# Patient Record
Sex: Female | Born: 1975 | Race: White | Hispanic: No | Marital: Married | Smoking: Never smoker
Health system: Southern US, Community
[De-identification: ages and names within clinical notes are randomized; demographics above are authoritative.]

## PROBLEM LIST (undated history)

## (undated) DIAGNOSIS — T4145XA Adverse effect of unspecified anesthetic, initial encounter: Secondary | ICD-10-CM

## (undated) DIAGNOSIS — O00109 Unspecified tubal pregnancy without intrauterine pregnancy: Secondary | ICD-10-CM

## (undated) DIAGNOSIS — E7212 Methylenetetrahydrofolate reductase deficiency: Secondary | ICD-10-CM

## (undated) DIAGNOSIS — M545 Low back pain, unspecified: Secondary | ICD-10-CM

## (undated) DIAGNOSIS — E7211 Homocystinuria: Secondary | ICD-10-CM

## (undated) DIAGNOSIS — T65811A Toxic effect of latex, accidental (unintentional), initial encounter: Secondary | ICD-10-CM

## (undated) DIAGNOSIS — G8929 Other chronic pain: Secondary | ICD-10-CM

## (undated) DIAGNOSIS — T7801XA Anaphylactic reaction due to peanuts, initial encounter: Secondary | ICD-10-CM

## (undated) DIAGNOSIS — O24419 Gestational diabetes mellitus in pregnancy, unspecified control: Secondary | ICD-10-CM

## (undated) DIAGNOSIS — T8859XA Other complications of anesthesia, initial encounter: Secondary | ICD-10-CM

## (undated) DIAGNOSIS — IMO0002 Reserved for concepts with insufficient information to code with codable children: Secondary | ICD-10-CM

## (undated) DIAGNOSIS — I499 Cardiac arrhythmia, unspecified: Secondary | ICD-10-CM

## (undated) DIAGNOSIS — D4709 Other mast cell neoplasms of uncertain behavior: Secondary | ICD-10-CM

## (undated) HISTORY — DX: Unspecified tubal pregnancy without intrauterine pregnancy: O00.109

## (undated) HISTORY — DX: Methylenetetrahydrofolate reductase deficiency: E72.11

## (undated) HISTORY — DX: Anaphylactic reaction due to peanuts, initial encounter: T78.01XA

## (undated) HISTORY — DX: Reserved for concepts with insufficient information to code with codable children: IMO0002

## (undated) HISTORY — PX: DILATION AND CURETTAGE OF UTERUS: SHX78

## (undated) HISTORY — DX: Homocystinuria: E72.12

## (undated) HISTORY — DX: Toxic effect of latex, accidental (unintentional), initial encounter: T65.811A

---

## 1999-05-23 DIAGNOSIS — O24419 Gestational diabetes mellitus in pregnancy, unspecified control: Secondary | ICD-10-CM

## 2005-05-22 HISTORY — PX: SPINE SURGERY: SHX786

## 2005-05-22 HISTORY — PX: AUGMENTATION MAMMAPLASTY: SUR837

## 2009-10-25 ENCOUNTER — Ambulatory Visit: Payer: Self-pay | Admitting: Physical Medicine and Rehabilitation

## 2010-01-12 ENCOUNTER — Ambulatory Visit: Payer: Self-pay | Admitting: Physical Medicine and Rehabilitation

## 2010-05-22 HISTORY — PX: LAPAROSCOPY FOR ECTOPIC PREGNANCY: SUR765

## 2010-09-03 ENCOUNTER — Emergency Department: Payer: Self-pay | Admitting: Internal Medicine

## 2010-09-04 ENCOUNTER — Emergency Department: Payer: Self-pay | Admitting: Emergency Medicine

## 2010-10-16 ENCOUNTER — Emergency Department: Payer: Self-pay | Admitting: Emergency Medicine

## 2011-01-02 ENCOUNTER — Emergency Department: Payer: Self-pay | Admitting: Emergency Medicine

## 2011-01-08 ENCOUNTER — Ambulatory Visit: Payer: Self-pay | Admitting: Emergency Medicine

## 2011-01-16 ENCOUNTER — Emergency Department: Payer: Self-pay | Admitting: Emergency Medicine

## 2011-01-19 ENCOUNTER — Emergency Department: Payer: Self-pay | Admitting: Emergency Medicine

## 2011-01-21 DIAGNOSIS — O00109 Unspecified tubal pregnancy without intrauterine pregnancy: Secondary | ICD-10-CM

## 2011-01-21 HISTORY — DX: Unspecified tubal pregnancy without intrauterine pregnancy: O00.109

## 2011-01-27 ENCOUNTER — Ambulatory Visit: Payer: Self-pay | Admitting: General Practice

## 2011-03-10 ENCOUNTER — Emergency Department: Payer: Self-pay | Admitting: Emergency Medicine

## 2011-04-22 HISTORY — PX: CHOLECYSTECTOMY: SHX55

## 2011-05-10 ENCOUNTER — Ambulatory Visit (INDEPENDENT_AMBULATORY_CARE_PROVIDER_SITE_OTHER): Payer: PRIVATE HEALTH INSURANCE | Admitting: Internal Medicine

## 2011-05-10 ENCOUNTER — Ambulatory Visit: Payer: Self-pay | Admitting: Obstetrics and Gynecology

## 2011-05-10 ENCOUNTER — Encounter: Payer: Self-pay | Admitting: Internal Medicine

## 2011-05-10 DIAGNOSIS — Z8759 Personal history of other complications of pregnancy, childbirth and the puerperium: Secondary | ICD-10-CM | POA: Insufficient documentation

## 2011-05-10 DIAGNOSIS — R739 Hyperglycemia, unspecified: Secondary | ICD-10-CM

## 2011-05-10 DIAGNOSIS — T782XXA Anaphylactic shock, unspecified, initial encounter: Secondary | ICD-10-CM

## 2011-05-10 DIAGNOSIS — Z3169 Encounter for other general counseling and advice on procreation: Secondary | ICD-10-CM

## 2011-05-10 DIAGNOSIS — T781XXA Other adverse food reactions, not elsewhere classified, initial encounter: Secondary | ICD-10-CM

## 2011-05-10 DIAGNOSIS — E7889 Other lipoprotein metabolism disorders: Secondary | ICD-10-CM

## 2011-05-10 DIAGNOSIS — R7309 Other abnormal glucose: Secondary | ICD-10-CM

## 2011-05-10 DIAGNOSIS — T7840XA Allergy, unspecified, initial encounter: Secondary | ICD-10-CM

## 2011-05-10 DIAGNOSIS — E789 Disorder of lipoprotein metabolism, unspecified: Secondary | ICD-10-CM

## 2011-05-10 DIAGNOSIS — E785 Hyperlipidemia, unspecified: Secondary | ICD-10-CM

## 2011-05-10 DIAGNOSIS — M48061 Spinal stenosis, lumbar region without neurogenic claudication: Secondary | ICD-10-CM | POA: Insufficient documentation

## 2011-05-10 LAB — COMPREHENSIVE METABOLIC PANEL
ALT: 15 U/L (ref 0–35)
CO2: 27 mEq/L (ref 19–32)
Calcium: 9 mg/dL (ref 8.4–10.5)
Chloride: 107 mEq/L (ref 96–112)
Creatinine, Ser: 0.8 mg/dL (ref 0.4–1.2)
GFR: 86.62 mL/min (ref >60.00)
Glucose, Bld: 110 mg/dL — ABNORMAL HIGH (ref 70–99)
Sodium: 142 mEq/L (ref 135–145)
Total Bilirubin: 0.5 mg/dL (ref 0.3–1.2)
Total Protein: 7.9 g/dL (ref 6.0–8.3)

## 2011-05-10 LAB — LIPID PANEL
Cholesterol: 188 mg/dL (ref 0–200)
HDL: 44.8 mg/dL (ref >39.00)
VLDL: 17.8 mg/dL (ref 0.0–40.0)

## 2011-05-10 MED ORDER — CETIRIZINE HCL 10 MG PO TABS: 10.0000 mg | ORAL_TABLET | Freq: Every day | ORAL | Status: DC

## 2011-05-10 MED ORDER — RANITIDINE HCL 300 MG PO TABS: 300.0000 mg | ORAL_TABLET | Freq: Every day | ORAL | Status: DC

## 2011-05-10 MED ORDER — MONTELUKAST SODIUM 10 MG PO TABS: 10.0000 mg | ORAL_TABLET | Freq: Every day | ORAL | Status: DC

## 2011-05-10 NOTE — Patient Instructions (Signed)
Consider Singulair,  zantac and zyrtec for complete histamine blockade

## 2011-05-10 NOTE — Progress Notes (Signed)
Subjective:    Patient ID: Susan Sullivan, female    DOB: 11-Jul-1975, 35 y.o.   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  She has several medical issues to discuss.  She had abnormal non fasting lipids at [place] (likely postprandial) and is requesting a recheck today since she is fasting.  She had a total cholesterol of 150 in 2011.  She has a history of gestational diabetes and has had a normal A1c currently.   Other CC is persistent pelvic pain on the right following an ectopic pregnancy on the left and ruptured ovarian cyst on the right.  She is scheduled to see Dr. Greggory Keen today to clarify the prior surgery that was done by Dr. Patton Salles.  Apparently she had an emergent resection of her left fallopian tube due to an ectopic pregnancy which ruptured but also had an ovarian cyst on the contralateral side and is not shure whether she has a viable tube at this point for future desired pregnancy.   She has a complex allergy history .  She had an episode of anaphylaxis to peanut butter May 2011, occurred in ER .  Did not require intubation. On another occasion  she developed hives on tongue, and was diagnosed with Oral allergy Syndrome by allergist Judeth Porch at [facility] after having anincomplete RAST testing by [facility] .  Cannot eat raw fresh fruit or veggies.  She also has allergies to Latex and egg allergy, as well as all nuts.  Pregnancy seems to aggravate her allergies, and despite having three children, she desires another.    Past Medical History  Diagnosis Date  . Ectopic pregnancy, tubal Sept 2012    left,  with rupture ,  and concurrent right ovarian cyst rupture  . Degenerative disk disease     not on medication due to attempts to get pregant  . 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   Review of Systems  Constitutional: Negative for fever, chills  and unexpected weight change.  HENT: Negative for hearing loss, ear pain, nosebleeds, congestion, sore throat, facial swelling, rhinorrhea, sneezing, mouth sores, trouble swallowing, neck pain, neck stiffness, voice change, postnasal drip, sinus pressure, tinnitus and ear discharge.   Eyes: Negative for pain, discharge, redness and visual disturbance.  Respiratory: Negative for cough, chest tightness, shortness of breath, wheezing and stridor.   Cardiovascular: Negative for chest pain, palpitations and leg swelling.  Genitourinary: Positive for flank pain and pelvic pain.  Musculoskeletal: Negative for myalgias and arthralgias.  Skin: Negative for color change and rash.  Neurological: Negative for dizziness, weakness, light-headedness and headaches.  Hematological: Negative for adenopathy.   BP 122/80  Pulse 83  Temp(Src) 98.8 F (37.1 C) (Oral)  Ht 5\' 8"  (1.727 m)  Wt 203 lb (92.08 kg)  BMI 30.87 kg/m2  SpO2 100%  LMP 04/18/2011    Objective:   Physical Exam  Constitutional: She is oriented to person, place, and time. She appears well-developed and well-nourished.  HENT:  Mouth/Throat: Oropharynx is clear and moist.  Eyes: EOM are normal. Pupils are equal, round, and reactive to light. No scleral icterus.  Neck: Normal range of motion. Neck supple. No JVD present. No thyromegaly present.  Cardiovascular: Normal rate, regular rhythm, normal heart sounds and intact distal pulses.   Pulmonary/Chest: Effort normal and breath sounds normal.  Abdominal: Soft. Bowel sounds are normal. She exhibits no mass. There is no tenderness.  Musculoskeletal: Normal range of motion. She exhibits no edema.  Lymphadenopathy:    She has no cervical adenopathy.  Neurological: She is alert and oriented to person, place, and time.  Skin: Skin is warm and dry.  Psychiatric: She has a normal mood and affect.       Assessment & Plan:   Oral allergy syndrome Diagnosed at [facility] with history of multiple  anaphylactic or near anaphylactic events,.  Suggested daily use of singulair,  allegra, and zantac for total histamine blockage,  All are acceptable in pregnancy , since she is desiriing to conceive.   Pre-conception counseling Discussed her need to start taking folic acid 1 mg daily to prevent neural tube defects. She does not drink or smoke.  All previosuly mentioned mediations to treat OAS are acceptable in pregnancy.   Lipids abnormal Fasting lipids will be repeated today.     Updated Medication List Outpatient Encounter Prescriptions as of 05/10/2011  Medication Sig Dispense Refill  . cetirizine (ZYRTEC) 10 MG tablet Take 1 tablet (10 mg total) by mouth daily.  90 tablet  3  . montelukast (SINGULAIR) 10 MG tablet Take 1 tablet (10 mg total) by mouth at bedtime.  90 tablet  3  . ranitidine (ZANTAC) 300 MG tablet Take 1 tablet (300 mg total) by mouth at bedtime.  90 tablet  3

## 2011-05-12 ENCOUNTER — Emergency Department: Payer: Self-pay | Admitting: *Deleted

## 2011-05-14 ENCOUNTER — Encounter: Payer: Self-pay | Admitting: Internal Medicine

## 2011-05-14 DIAGNOSIS — E7889 Other lipoprotein metabolism disorders: Secondary | ICD-10-CM | POA: Insufficient documentation

## 2011-05-14 DIAGNOSIS — T65811A Toxic effect of latex, accidental (unintentional), initial encounter: Secondary | ICD-10-CM | POA: Insufficient documentation

## 2011-05-14 DIAGNOSIS — T7801XA Anaphylactic reaction due to peanuts, initial encounter: Secondary | ICD-10-CM | POA: Insufficient documentation

## 2011-05-14 DIAGNOSIS — T781XXA Other adverse food reactions, not elsewhere classified, initial encounter: Secondary | ICD-10-CM | POA: Insufficient documentation

## 2011-05-14 DIAGNOSIS — Z3169 Encounter for other general counseling and advice on procreation: Secondary | ICD-10-CM | POA: Insufficient documentation

## 2011-05-14 NOTE — Assessment & Plan Note (Signed)
Discussed her need to start taking folic acid 1 mg daily to prevent neural tube defects. She does not drink or smoke.  All previosuly mentioned mediations to treat OAS are acceptable in pregnancy.

## 2011-05-14 NOTE — Assessment & Plan Note (Signed)
Fasting lipids will be repeated today.

## 2011-05-14 NOTE — Assessment & Plan Note (Signed)
Diagnosed at [facility] with history of multiple anaphylactic or near anaphylactic events,.  Suggested daily use of singulair,  allegra, and zantac for total histamine blockage,  All are acceptable in pregnancy , since she is desiriing to conceive.

## 2011-05-17 ENCOUNTER — Ambulatory Visit: Payer: Self-pay | Admitting: Vascular Surgery

## 2011-07-14 ENCOUNTER — Other Ambulatory Visit: Payer: Self-pay | Admitting: Physician Assistant

## 2011-07-14 LAB — HCG, QUANTITATIVE, PREGNANCY: Beta Hcg, Quant.: 59 m[IU]/mL — ABNORMAL HIGH

## 2011-07-16 ENCOUNTER — Other Ambulatory Visit: Payer: Self-pay | Admitting: Physician Assistant

## 2011-07-16 LAB — HCG, QUANTITATIVE, PREGNANCY: Beta Hcg, Quant.: 202 m[IU]/mL — ABNORMAL HIGH

## 2011-09-10 ENCOUNTER — Emergency Department: Payer: Self-pay | Admitting: Emergency Medicine

## 2011-09-10 LAB — COMPREHENSIVE METABOLIC PANEL
Albumin: 4 g/dL (ref 3.4–5.0)
Alkaline Phosphatase: 80 U/L (ref 50–136)
Alkaline Phosphatase: 100 U/L (ref 50–136)
Anion Gap: 12 (ref 7–16)
Anion Gap: 10 (ref 7–16)
BUN: 9 mg/dL (ref 7–18)
Bilirubin,Total: 0.3 mg/dL (ref 0.2–1.0)
Bilirubin,Total: 0.5 mg/dL (ref 0.2–1.0)
Co2: 25 mmol/L (ref 21–32)
Creatinine: 0.66 mg/dL (ref 0.60–1.30)
EGFR (African American): >60
EGFR (Non-African Amer.): >60
Osmolality: 285 (ref 275–301)
Osmolality: 279 (ref 275–301)
SGOT(AST): 152 U/L — ABNORMAL HIGH (ref 15–37)
SGPT (ALT): 140 U/L — ABNORMAL HIGH
Sodium: 141 mmol/L (ref 136–145)
Total Protein: 8.2 g/dL (ref 6.4–8.2)
Total Protein: 7.9 g/dL (ref 6.4–8.2)

## 2011-09-10 LAB — URINALYSIS, COMPLETE
Bacteria: NONE SEEN
Bilirubin,UR: NEGATIVE
Glucose,UR: NEGATIVE mg/dL (ref 0–75)
Ketone: NEGATIVE
Ketone: NEGATIVE
Leukocyte Esterase: NEGATIVE
Nitrite: NEGATIVE
Protein: NEGATIVE
RBC,UR: NONE SEEN /HPF (ref 0–5)
RBC,UR: NONE SEEN /HPF (ref 0–5)
Specific Gravity: 1.005 (ref 1.003–1.030)
Specific Gravity: 1.005 (ref 1.003–1.030)
Squamous Epithelial: 2
Squamous Epithelial: 1
WBC UR: NONE SEEN /HPF (ref 0–5)

## 2011-09-10 LAB — DIFFERENTIAL
Basophil #: 0 10*3/uL (ref 0.0–0.1)
Lymphocyte %: 19.1 %
Neutrophil #: 4.6 10*3/uL (ref 1.4–6.5)
Neutrophil %: 73.3 %

## 2011-09-10 LAB — CBC
HCT: 38.1 % (ref 35.0–47.0)
HGB: 13.2 g/dL (ref 12.0–16.0)
MCH: 29.7 pg (ref 26.0–34.0)
MCHC: 32.6 g/dL (ref 32.0–36.0)
MCV: 91 fL (ref 80–100)
Platelet: 191 10*3/uL (ref 150–440)
RBC: 4.44 10*6/uL (ref 3.80–5.20)
RBC: 4.23 10*6/uL (ref 3.80–5.20)
WBC: 6.3 10*3/uL (ref 3.6–11.0)

## 2011-09-11 ENCOUNTER — Observation Stay: Payer: Self-pay | Admitting: Obstetrics & Gynecology

## 2011-09-11 LAB — COMPREHENSIVE METABOLIC PANEL
Albumin: 3.2 g/dL — ABNORMAL LOW (ref 3.4–5.0)
Anion Gap: 6 — ABNORMAL LOW (ref 7–16)
Bilirubin,Total: 0.4 mg/dL (ref 0.2–1.0)
Calcium, Total: 8.2 mg/dL — ABNORMAL LOW (ref 8.5–10.1)
Chloride: 108 mmol/L — ABNORMAL HIGH (ref 98–107)
Co2: 28 mmol/L (ref 21–32)
Creatinine: 0.65 mg/dL (ref 0.60–1.30)
EGFR (Non-African Amer.): >60
Glucose: 90 mg/dL (ref 65–99)
Osmolality: 280 (ref 275–301)
SGPT (ALT): 246 U/L — ABNORMAL HIGH
Sodium: 142 mmol/L (ref 136–145)

## 2011-09-11 LAB — GENTAMICIN LEVEL, TROUGH: Gentamicin, Trough: 0.9 ug/mL (ref 0.0–2.0)

## 2011-09-12 LAB — GENTAMICIN LEVEL, PEAK: Gentamicin, Peak: 7.3 ug/mL (ref 4.0–8.0)

## 2011-09-12 LAB — COMPREHENSIVE METABOLIC PANEL
Anion Gap: 7 (ref 7–16)
BUN: 9 mg/dL (ref 7–18)
Co2: 29 mmol/L (ref 21–32)
EGFR (African American): >60
Osmolality: 284 (ref 275–301)
Potassium: 3.8 mmol/L (ref 3.5–5.1)
SGOT(AST): 109 U/L — ABNORMAL HIGH (ref 15–37)
SGPT (ALT): 177 U/L — ABNORMAL HIGH
Sodium: 143 mmol/L (ref 136–145)
Total Protein: 6.8 g/dL (ref 6.4–8.2)

## 2011-09-12 LAB — LACTATE DEHYDROGENASE: LDH: 183 U/L (ref 84–246)

## 2011-09-14 LAB — PATHOLOGY REPORT

## 2011-11-07 ENCOUNTER — Ambulatory Visit: Payer: PRIVATE HEALTH INSURANCE | Admitting: Internal Medicine

## 2012-01-04 ENCOUNTER — Ambulatory Visit (INDEPENDENT_AMBULATORY_CARE_PROVIDER_SITE_OTHER): Payer: PRIVATE HEALTH INSURANCE | Admitting: Internal Medicine

## 2012-01-04 ENCOUNTER — Other Ambulatory Visit: Payer: Self-pay | Admitting: Internal Medicine

## 2012-01-04 ENCOUNTER — Encounter: Payer: Self-pay | Admitting: Internal Medicine

## 2012-01-04 VITALS — BP 154/92 | HR 108 | Temp 97.7°F | Resp 18 | Wt 210.0 lb

## 2012-01-04 DIAGNOSIS — E7212 Methylenetetrahydrofolate reductase deficiency: Secondary | ICD-10-CM | POA: Insufficient documentation

## 2012-01-04 DIAGNOSIS — T781XXA Other adverse food reactions, not elsewhere classified, initial encounter: Secondary | ICD-10-CM

## 2012-01-04 DIAGNOSIS — R030 Elevated blood-pressure reading, without diagnosis of hypertension: Secondary | ICD-10-CM

## 2012-01-04 DIAGNOSIS — E7210 Disorders of sulfur-bearing amino-acid metabolism, unspecified: Secondary | ICD-10-CM

## 2012-01-04 LAB — CBC WITH DIFFERENTIAL/PLATELET
Basophil %: 0.8 %
Eosinophil #: 0.1 10*3/uL (ref 0.0–0.7)
Eosinophil %: 2.4 %
HGB: 12.9 g/dL (ref 12.0–16.0)
Lymphocyte %: 16.9 %
Monocyte %: 7.2 %
Neutrophil %: 72.7 %
RBC: 4.25 10*6/uL (ref 3.80–5.20)

## 2012-01-04 MED ORDER — L-METHYLFOLATE 15 MG PO TABS: 1.0000 | ORAL_TABLET | Freq: Every day | ORAL | Status: DC

## 2012-01-04 NOTE — Assessment & Plan Note (Addendum)
Diagnosed in April  after her third miscarriage and subsequent D&C resulted in a uterine clot with retained POC. Advised her to start the Deplin and have sent a prescription to [facility] for continued use.

## 2012-01-04 NOTE — Progress Notes (Signed)
Patient ID: Maggie Font, female   DOB: [birth year], 36 y.o.   MRN: [id number]   Patient Active Problem List  Diagnosis  . History of ectopic pregnancy  . Degenerative disk disease  . 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 Oral allergy syndrome  . Pre-conception counseling  . Lipids abnormal  . Methylenetetrahydrofolate reductase (MTHFR) gene mutation  . Elevated blood pressure reading without diagnosis of hypertension    Subjective:  CC:   Chief Complaint  Patient presents with  . Medication Problem    HPI:   Aideliz Porter-Mooreis a 36 y.o. female who presents  Past Medical History  Diagnosis Date  . Ectopic pregnancy, tubal Sept 2012    left,  with rupture ,  and concurrent right ovarian cyst rupture  . Degenerative disk disease     not on medication due to attempts to get pregant  . 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 Cesarean section   . Dilation and curettage of uterus 2012  . Spine surgery 2007    SI joint fusion [facility]         The following portions of the patient's history were reviewed and updated as appropriate: Allergies, current medications, and problem list.    Review of Systems:   12 Pt  review of systems was negative except those addressed in the HPI,     History   Social History  . Marital Status: Married    Spouse Name: N/A    Number of Children: N/A  . Years of Education: N/A   Occupational History  . Not on file.   Social History Main Topics  . Smoking status: Never Smoker   . Smokeless tobacco: Not on file  . Alcohol Use: Not on file  . Drug Use: Not on file  . Sexually Active: Not on file   Other Topics Concern  . Not on file   Social History Narrative  . No narrative on file    Objective:  BP 154/92  Pulse 108  Temp 97.7 F (36.5 C) (Oral)  Resp 18  Wt 210 lb (95.255 kg)  SpO2 98%  LMP  12/07/2011  General appearance: alert, cooperative and appears stated age Ears: normal TM's and external ear canals both ears Throat: lips, mucosa, and tongue normal; teeth and gums normal Neck: no adenopathy, no carotid bruit, supple, symmetrical, trachea midline and thyroid not enlarged, symmetric, no tenderness/mass/nodules Back: symmetric, no curvature. ROM normal. No CVA tenderness. Lungs: clear to auscultation bilaterally Heart: regular rate and rhythm, S1, S2 normal, no murmur, click, rub or gallop Abdomen: soft, non-tender; bowel sounds normal; no masses,  no organomegaly Pulses: 2+ and symmetric Skin: Skin color, texture, turgor normal. No rashes or lesions Lymph nodes: Cervical, supraclavicular, and axillary nodes normal.  Assessment and Plan:  Methylenetetrahydrofolate reductase (MTHFR) gene mutation Diagnosed in April  after her third miscarriage and subsequent D&C resulted in a uterine clot with retained POC. Advised her to start the Deplin and have sent a prescription to [facility] for continued use.  Oral allergy syndrome Her multiple food and drug allergies have now been explained by the diagnosis of MTHFR.  It is not clear whether treatment of the syndrome will resolve her allergies are not. I suggested that she not rechallenge herself.  Elevated blood pressure reading without diagnosis of hypertension She has been normotensive in the past. Today's  reading may be due to the oral decongestants that she has been taking for a viral respiratory infection. She will have it rechecked in the ER at work once she is off of the medications.   Updated Medication List Outpatient Encounter Prescriptions as of 01/04/2012  Medication Sig Dispense Refill  . Ascorbic Acid (VITAMIN C) 100 MG tablet Take 100 mg by mouth daily.      [name] aspirin 81 MG tablet Take 81 mg by mouth daily.      . cetirizine (ZYRTEC) 10 MG tablet Take 1 tablet (10 mg total) by mouth daily.  90 tablet  3  . montelukast  (SINGULAIR) 10 MG tablet Take 1 tablet (10 mg total) by mouth at bedtime.  90 tablet  3  . Prenatal Vit-Fe Fumarate-FA (PRENATAL MULTIVITAMIN) TABS Take 1 tablet by mouth daily.      . ranitidine (ZANTAC) 300 MG tablet Take 1 tablet (300 mg total) by mouth at bedtime.  90 tablet  3  . vitamin B-12 (CYANOCOBALAMIN) 100 MCG tablet Take 50 mcg by mouth daily.      [name] L-Methylfolate 15 MG TABS Take 1 tablet (15 mg total) by mouth daily.  30 tablet  6     No orders of the defined types were placed in this encounter.    Return in about 3 months (around 04/05/2012).

## 2012-01-04 NOTE — Patient Instructions (Addendum)
You have a viral  Syndrome .  The post nasal drip is causing your sore throat.  Lavage your sinuses twice daly with Simply saline nasal spray.  Use benadryl 25 mg every 8 hours and Sudafed PE 10 to 30 every 8 hours to manage the drainage and congestion.  Gargle with salt water often for the sore throat. .  If the throat is no better  In 3 to 4 days OR  if you develop T > 100.4,  Green nasal discharge,  Or facial pain,  E mAIL ME ON mYCHART for an antibiotic.

## 2012-01-07 ENCOUNTER — Encounter: Payer: Self-pay | Admitting: Internal Medicine

## 2012-01-07 DIAGNOSIS — R030 Elevated blood-pressure reading, without diagnosis of hypertension: Secondary | ICD-10-CM | POA: Insufficient documentation

## 2012-01-07 NOTE — Assessment & Plan Note (Signed)
She has been normotensive in the past. Today's reading may be due to the oral decongestants that she has been taking for a viral respiratory infection. She will have it rechecked in the ER at work once she is off of the medications.

## 2012-01-07 NOTE — Assessment & Plan Note (Signed)
Her multiple food and drug allergies have now been explained by the diagnosis of MTHFR.  It is not clear whether treatment of the syndrome will resolve her allergies are not. I suggested that she not rechallenge herself.

## 2012-01-08 ENCOUNTER — Telehealth: Payer: Self-pay | Admitting: Internal Medicine

## 2012-01-08 NOTE — Telephone Encounter (Signed)
Her cbc was normal

## 2012-01-08 NOTE — Telephone Encounter (Signed)
Patient notified

## 2012-01-09 ENCOUNTER — Encounter: Payer: Self-pay | Admitting: Internal Medicine

## 2012-01-09 DIAGNOSIS — J069 Acute upper respiratory infection, unspecified: Secondary | ICD-10-CM

## 2012-01-09 MED ORDER — AZITHROMYCIN 250 MG PO TABS: ORAL_TABLET | ORAL | Status: AC

## 2012-01-12 ENCOUNTER — Encounter: Payer: Self-pay | Admitting: Internal Medicine

## 2012-01-15 ENCOUNTER — Telehealth: Payer: Self-pay | Admitting: Internal Medicine

## 2012-01-15 DIAGNOSIS — N760 Acute vaginitis: Secondary | ICD-10-CM

## 2012-01-15 MED ORDER — FLUCONAZOLE 150 MG PO TABS: 150.0000 mg | ORAL_TABLET | Freq: Every day | ORAL | Status: AC

## 2012-01-19 ENCOUNTER — Encounter: Payer: Self-pay | Admitting: Internal Medicine

## 2012-02-23 ENCOUNTER — Encounter: Payer: Self-pay | Admitting: Internal Medicine

## 2012-02-23 ENCOUNTER — Ambulatory Visit (INDEPENDENT_AMBULATORY_CARE_PROVIDER_SITE_OTHER): Payer: PRIVATE HEALTH INSURANCE | Admitting: Internal Medicine

## 2012-02-23 VITALS — BP 118/72 | HR 111 | Temp 98.4°F | Ht 67.5 in | Wt 209.5 lb

## 2012-02-23 DIAGNOSIS — M546 Pain in thoracic spine: Secondary | ICD-10-CM

## 2012-02-23 DIAGNOSIS — R197 Diarrhea, unspecified: Secondary | ICD-10-CM

## 2012-02-23 DIAGNOSIS — R1011 Right upper quadrant pain: Secondary | ICD-10-CM

## 2012-02-23 MED ORDER — DICLOFENAC EPOLAMINE 1.3 % TD PTCH: 1.0000 | MEDICATED_PATCH | Freq: Two times a day (BID) | TRANSDERMAL | Status: DC

## 2012-02-23 MED ORDER — CHOLESTYRAMINE 4 GM/DOSE PO POWD: 2.0000 g | Freq: Three times a day (TID) | ORAL | Status: DC

## 2012-02-23 NOTE — Progress Notes (Signed)
Patient ID: Susan Sullivan, female   DOB: [birth year], 36 y.o.   MRN: [id number]  Patient Active Problem List  Diagnosis  . History of ectopic pregnancy  . Degenerative disk disease  . 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 Oral allergy syndrome  . Pre-conception counseling  . Lipids abnormal  . Methylenetetrahydrofolate reductase (MTHFR) gene mutation  . Elevated blood pressure reading without diagnosis of hypertension  . Back pain, thoracic  . Diarrhea in adult patient  . Colicky RUQ abdominal pain    Subjective:  CC:   Chief Complaint  Patient presents with  . Diarrhea    HPI:   Susan Porter-Mooreis a 36 y.o. 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  The episodes started after her cholecystectomy in December. The episodes are followed by constipation for 3 or 4 days.  She as tried eliminating gluten from her diet with no change in pattern.  She also reports episodes of severe RUQ pain for months as well, which Improves moderately but not completely with manual pressure and a heating pad.  The pain is independent from the diarrhea and not accompanied by nausea or vomiting. There has been no change in the color of her stools. There has been no weight loss.    Past Medical History  Diagnosis Date  . Ectopic pregnancy, tubal Sept 2012    left,  with rupture ,  and concurrent right ovarian cyst rupture  . Degenerative disk disease     not on medication due to attempts to get pregant  . 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 Cesarean section   . Dilation and curettage of uterus 2012  . Spine surgery 2007    SI joint  fusion [facility]  . Cholecystectomy Dec 2012    [facility]us Barren, [facility]   The following portions of the patient's history were reviewed and updated as appropriate: Allergies, current medications, and problem list.    Review of Systems:   12 Pt  review of systems was negative except those addressed in the HPI,     History   Social History  . Marital Status: Married    Spouse Name: N/A    Number of Children: N/A  . Years of Education: N/A   Occupational History  . Not on file.   Social History Main Topics  . Smoking status: Never Smoker   . Smokeless tobacco: Not on file  . Alcohol Use: Not on file  . Drug Use: Not on file  . Sexually Active: Not on file   Other Topics Concern  . Not on file   Social History Narrative  . No narrative on file    Objective:  BP 118/72  Pulse 111  Temp 98.4 F (36.9 C) (Oral)  Ht 5' 7.5" (1.715 m)  Wt 209 lb 8 oz (95.029 kg)  BMI 32.33 kg/m2  SpO2 99%  LMP 02/04/2012  General appearance: alert, cooperative and appears stated age Ears: normal TM's and external ear canals both ears Throat: lips, mucosa, and tongue normal; teeth and gums normal Neck: no adenopathy, no carotid bruit, supple, symmetrical, trachea midline  and thyroid not enlarged, symmetric, no tenderness/mass/nodules Back: symmetric, no curvature. ROM normal. No CVA tenderness. Lungs: clear to auscultation bilaterally Heart: regular rate and rhythm, S1, S2 normal, no murmur, click, rub or gallop Abdomen: soft, non-tender; bowel sounds normal; no masses,  no organomegaly Pulses: 2+ and symmetric Skin: Skin color, texture, turgor normal. No rashes or lesions Lymph nodes: Cervical, supraclavicular, and axillary nodes normal.  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  Trial of Questran, starting with 2 mg twice daily and titrating to 4  mg if needed.   Colicky RUQ abdominal pain Etiology unclear. May be related to her diarrhea. If it does not improve with trial of Questran she will need an MRCP to rule out retained stone.   Updated Medication List Outpatient Encounter Prescriptions as of 02/23/2012  Medication Sig Dispense Refill  . Ascorbic Acid (VITAMIN C) 100 MG tablet Take 100 mg by mouth daily.      [name] aspirin 81 MG tablet Take 81 mg by mouth daily.      . cetirizine (ZYRTEC) 10 MG tablet Take 1 tablet (10 mg total) by mouth daily.  90 tablet  3  . L-Methylfolate 15 MG TABS Take 1 tablet (15 mg total) by mouth daily.  30 tablet  6  . montelukast (SINGULAIR) 10 MG tablet Take 1 tablet (10 mg total) by mouth at bedtime.  90 tablet  3  . ranitidine (ZANTAC) 300 MG tablet Take 1 tablet (300 mg total) by mouth at bedtime.  90 tablet  3  . vitamin B-12 (CYANOCOBALAMIN) 100 MCG tablet Take 50 mcg by mouth daily.      . cholestyramine (QUESTRAN) 4 GM/DOSE powder Take 0.5 packets (2 g total) by mouth 3 (three) times daily with meals.  378 g  12  . diclofenac (FLECTOR) 1.3 % PTCH Place 1 patch onto the skin 2 (two) times daily.  30 patch  2  . Prenatal Vit-Fe Fumarate-FA (PRENATAL MULTIVITAMIN) TABS Take 1 tablet by mouth daily.         No orders of the defined types were placed in this encounter.    No Follow-up on file.

## 2012-02-24 ENCOUNTER — Encounter: Payer: Self-pay | Admitting: Internal Medicine

## 2012-02-25 ENCOUNTER — Encounter: Payer: Self-pay | Admitting: Internal Medicine

## 2012-02-25 DIAGNOSIS — R1011 Right upper quadrant pain: Secondary | ICD-10-CM | POA: Insufficient documentation

## 2012-02-25 DIAGNOSIS — R197 Diarrhea, unspecified: Secondary | ICD-10-CM | POA: Insufficient documentation

## 2012-02-25 NOTE — Assessment & Plan Note (Signed)
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  Trial of Questran, starting with 2 mg twice daily and titrating to 4 mg if needed.

## 2012-02-25 NOTE — Assessment & Plan Note (Signed)
Etiology unclear. May be related to her diarrhea. If it does not improve with trial of Questran she will need an MRCP to rule out retained stone.

## 2012-02-29 ENCOUNTER — Telehealth: Payer: Self-pay

## 2012-02-29 NOTE — Telephone Encounter (Signed)
Spoke to [place] to get approval for [employer] for patient, she stated that the patient has to try the generic brand first. I spoke to the patient and she is willing to try the generic.

## 2012-03-01 ENCOUNTER — Telehealth: Payer: Self-pay

## 2012-03-01 ENCOUNTER — Telehealth: Payer: Self-pay | Admitting: Internal Medicine

## 2012-03-01 MED ORDER — DICLOFENAC EPOLAMINE 1.3 % TD PTCH: 1.0000 | MEDICATED_PATCH | Freq: Two times a day (BID) | TRANSDERMAL | Status: DC

## 2012-03-01 NOTE — Telephone Encounter (Signed)
Spoke to insurance and from what I understood they patient policy plan states that she has to try the

## 2012-03-01 NOTE — Telephone Encounter (Signed)
Called from [facility] pharmacy stating we had to get a prior auth for the patch for patient. She gave me a number of [phone].  I called to request a prior auth form and was told that the patient must first try the endsis (generic first). I advised representative that I was told by pharmacist that there wasn't any generic for patches.  She said check with provider they should know what generic patch patient should try.

## 2012-03-04 ENCOUNTER — Encounter: Payer: Self-pay | Admitting: Internal Medicine

## 2012-03-04 DIAGNOSIS — G8929 Other chronic pain: Secondary | ICD-10-CM

## 2012-03-04 DIAGNOSIS — R1011 Right upper quadrant pain: Secondary | ICD-10-CM

## 2012-03-04 MED ORDER — TRAMADOL HCL 50 MG PO TABS: ORAL_TABLET | ORAL | Status: DC

## 2012-03-04 MED ORDER — CYCLOBENZAPRINE HCL 10 MG PO TABS: 10.0000 mg | ORAL_TABLET | Freq: Three times a day (TID) | ORAL | Status: DC | PRN

## 2012-03-08 ENCOUNTER — Ambulatory Visit: Payer: Self-pay | Admitting: Internal Medicine

## 2012-03-11 ENCOUNTER — Telehealth: Payer: Self-pay | Admitting: Internal Medicine

## 2012-03-11 NOTE — Telephone Encounter (Signed)
Left message on patient vm letting her know that abdominal ultrasound was normal.

## 2012-03-11 NOTE — Telephone Encounter (Signed)
Abdominal ultrasound was normal

## 2012-03-18 ENCOUNTER — Encounter: Payer: Self-pay | Admitting: Internal Medicine

## 2012-03-19 ENCOUNTER — Ambulatory Visit: Payer: Self-pay | Admitting: General Practice

## 2012-03-19 ENCOUNTER — Other Ambulatory Visit: Payer: Self-pay | Admitting: General Practice

## 2012-03-19 LAB — BASIC METABOLIC PANEL
Anion Gap: 10 (ref 7–16)
Calcium, Total: 8.7 mg/dL (ref 8.5–10.1)
Chloride: 108 mmol/L — ABNORMAL HIGH (ref 98–107)
Co2: 26 mmol/L (ref 21–32)
Creatinine: 0.63 mg/dL (ref 0.60–1.30)
EGFR (African American): >60

## 2012-03-19 LAB — CBC WITH DIFFERENTIAL/PLATELET
Basophil #: 0 10*3/uL (ref 0.0–0.1)
Eosinophil #: 0.1 10*3/uL (ref 0.0–0.7)
HCT: 38.3 % (ref 35.0–47.0)
HGB: 13 g/dL (ref 12.0–16.0)
Lymphocyte %: 21.2 %
Monocyte %: 4.9 %
Neutrophil #: 7.3 10*3/uL — ABNORMAL HIGH (ref 1.4–6.5)
Neutrophil %: 72.4 %
RBC: 4.2 10*6/uL (ref 3.80–5.20)
WBC: 10.1 10*3/uL (ref 3.6–11.0)

## 2012-03-22 ENCOUNTER — Telehealth: Payer: Self-pay | Admitting: Internal Medicine

## 2012-03-22 NOTE — Telephone Encounter (Signed)
Pt also stated that she will still work but she may not be able to work the full 12 hours straight that why she wanted to [employer] to protcet her job.

## 2012-03-22 NOTE — Telephone Encounter (Signed)
Please have patient send me information about when she became ill, etc. via MyChart and I will fill out the FMLA that she drops off

## 2012-03-22 NOTE — Telephone Encounter (Signed)
Pt works at [employer] and went to see the nurse there. They ran a bunch of bloodwork and they her Epten bar was grossly positive and she think she has mono. They told her it would be a while or up to 6 weeks until she is completely better and the fatigue is extremely bad and she works 12 hour shifts. She was wondering if you would be able to do FMLA forms for her if possible, she says she could drop them off.   Best number [phone]

## 2012-03-25 ENCOUNTER — Encounter: Payer: Self-pay | Admitting: Internal Medicine

## 2012-03-26 ENCOUNTER — Encounter: Payer: Self-pay | Admitting: Internal Medicine

## 2012-03-26 NOTE — Telephone Encounter (Signed)
Patient notified to send information through my chart.

## 2012-03-28 ENCOUNTER — Encounter: Payer: Self-pay | Admitting: Internal Medicine

## 2012-03-29 ENCOUNTER — Encounter: Payer: Self-pay | Admitting: Internal Medicine

## 2012-03-29 DIAGNOSIS — B2790 Infectious mononucleosis, unspecified without complication: Secondary | ICD-10-CM | POA: Insufficient documentation

## 2012-04-08 ENCOUNTER — Encounter: Payer: Self-pay | Admitting: Internal Medicine

## 2012-04-08 ENCOUNTER — Ambulatory Visit (INDEPENDENT_AMBULATORY_CARE_PROVIDER_SITE_OTHER): Payer: PRIVATE HEALTH INSURANCE | Admitting: Internal Medicine

## 2012-04-08 VITALS — BP 110/72 | HR 94 | Temp 98.1°F | Resp 12 | Ht 68.0 in | Wt 210.2 lb

## 2012-04-08 DIAGNOSIS — B2790 Infectious mononucleosis, unspecified without complication: Secondary | ICD-10-CM

## 2012-04-08 NOTE — Assessment & Plan Note (Signed)
  Diagnosed oct 29 with fatigue, myalgias and EBG IgG of 7.3  .  Two week break from work recommended, followed  by 2/3 time x 2 weeks

## 2012-04-08 NOTE — Progress Notes (Signed)
Patient ID: Susan Sullivan, female   DOB: 1975/08/03, 36 y.o.   MRN: [id number]  Patient Active Problem List  Diagnosis  . History of ectopic pregnancy  . Degenerative disk disease  . 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 Oral allergy syndrome  . Pre-conception counseling  . Lipids abnormal  . Methylenetetrahydrofolate reductase (MTHFR) gene mutation  . Elevated blood pressure reading without diagnosis of hypertension  . Back pain, thoracic  . Diarrhea in adult patient  . Colicky RUQ abdominal pain  . Infectious mononucleosis    Subjective:  CC:   Chief Complaint  Patient presents with  . Follow-up    HPI:   Susan Porter-Mooreis a 36 y.o. female who presents for follow up on infectious mononucleosis  diagnosed 2 weeks ago at [facility] [employer] [facility].  Has had a rough two weeks.  She took a day off the week of diagnosis but worked 3 12 hour shifts last week and has declined over the last several days. She is aching all over for the past several days and has been nauseated a lot.  Had several episodes of emesis last night after eating at a steakhouse .    Not drinking much liquids except tea and diet mountain dew .  ultrasound of uterus oct 29 showed very mild hydronephrosis of left kidney so CT was ordered and was normal, specifically no splenomegaly.   Past Medical History  Diagnosis Date  . Ectopic pregnancy, tubal Sept 2012    left,  with rupture ,  and concurrent right ovarian cyst rupture  . Degenerative disk disease     not on medication due to attempts to get pregant  . 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 Cesarean section   . Dilation and curettage of uterus 2012  . Spine surgery 2007    SI joint fusion [employer]  . Cholecystectomy Dec 2012    Festus Barren, [facility]    The following portions of the patient's history were reviewed and updated as appropriate: Allergies,  current medications, and problem list.    Review of Systems:   Review of Systems  Constitutional: Positive for fever, malaise/fatigue and diaphoresis.  HENT: Positive for ear pain and sore throat. Negative for congestion.   Eyes: Negative for blurred vision and pain.  Respiratory: Positive for shortness of breath. Negative for cough and sputum production.   Cardiovascular: Negative for chest pain, palpitations and leg swelling.  Gastrointestinal: Positive for nausea and vomiting. Negative for heartburn and blood in stool.  Genitourinary: Negative for dysuria and hematuria.  Musculoskeletal: Positive for myalgias and joint pain.  Skin: Negative for itching and rash.  Neurological: Positive for headaches. Negative for dizziness, tremors and focal weakness.  Endo/Heme/Allergies: Does not bruise/bleed easily.  Psychiatric/Behavioral: Negative for depression, suicidal ideas and substance abuse.       History   Social History  . Marital Status: Married    Spouse Name: N/A    Number of Children: N/A  . Years of Education: N/A   Occupational History  . Not on file.   Social History Main Topics  . Smoking status: Never Smoker   . Smokeless tobacco: Not on file  . Alcohol Use: No  . Drug Use: Not on file  . Sexually Active: Not on file   Other Topics Concern  . Not on file   Social History Narrative  .  No narrative on file    Objective:  BP 110/72  Pulse 94  Temp 98.1 F (36.7 C) (Oral)  Resp 12  Ht 5\' 8"  (1.727 m)  Wt 210 lb 4 oz (95.369 kg)  BMI 31.97 kg/m2  SpO2 97%  LMP 04/01/2012  General appearance: alert, cooperative and appears stated age Ears: normal TM's and external ear canals both ears Throat: lips, mucosa, and tongue normal; teeth and gums normal Neck: no adenopathy, no carotid bruit, supple, symmetrical, trachea midline and thyroid not enlarged, symmetric, no tenderness/mass/nodules Back: symmetric, no curvature. ROM normal. No CVA  tenderness. Lungs: clear to auscultation bilaterally Heart: regular rate and rhythm, S1, S2 normal, no murmur, click, rub or gallop Abdomen: soft, non-tender; bowel sounds normal; no masses,  no organomegaly Pulses: 2+ and symmetric Skin: Skin color, texture, turgor normal. No rashes or lesions Lymph nodes: Cervical, supraclavicular, and axillary nodes normal.  Assessment and Plan:  Infectious mononucleosis  Diagnosed oct 29 with fatigue, myalgias and EBG IgG of 7.3  .  Two week break from work recommended, followed  by 2/3 time x 2 weeks     Updated Medication List Outpatient Encounter Prescriptions as of 04/08/2012  Medication Sig Dispense Refill  . Ascorbic Acid (VITAMIN C) 100 MG tablet Take 100 mg by mouth daily.      [name] aspirin 81 MG tablet Take 81 mg by mouth daily.      . cetirizine (ZYRTEC) 10 MG tablet Take 1 tablet (10 mg total) by mouth daily.  90 tablet  3  . cholestyramine (QUESTRAN) 4 GM/DOSE powder Take 0.5 packets (2 g total) by mouth 3 (three) times daily with meals.  378 g  12  . cyclobenzaprine (FLEXERIL) 10 MG tablet Take 1 tablet (10 mg total) by mouth 3 (three) times daily as needed for muscle spasms.  60 tablet  2  . diclofenac (FLECTOR) 1.3 % PTCH Place 1 patch onto the skin 2 (two) times daily.  30 patch  2  . L-Methylfolate 15 MG TABS Take 1 tablet (15 mg total) by mouth daily.  30 tablet  6  . montelukast (SINGULAIR) 10 MG tablet Take 1 tablet (10 mg total) by mouth at bedtime.  90 tablet  3  . Prenatal Vit-Fe Fumarate-FA (PRENATAL MULTIVITAMIN) TABS Take 1 tablet by mouth daily.      . ranitidine (ZANTAC) 300 MG tablet Take 1 tablet (300 mg total) by mouth at bedtime.  90 tablet  3  . traMADol (ULTRAM) 50 MG tablet 1 to 2 tablets every 6 hours prn pain  120 tablet  2  . vitamin B-12 (CYANOCOBALAMIN) 100 MCG tablet Take 50 mcg by mouth daily.         No orders of the defined types were placed in this encounter.    No Follow-up on file.

## 2012-04-09 ENCOUNTER — Encounter: Payer: Self-pay | Admitting: Internal Medicine

## 2012-04-09 DIAGNOSIS — Z0279 Encounter for issue of other medical certificate: Secondary | ICD-10-CM

## 2012-04-23 ENCOUNTER — Ambulatory Visit (INDEPENDENT_AMBULATORY_CARE_PROVIDER_SITE_OTHER): Payer: PRIVATE HEALTH INSURANCE | Admitting: Internal Medicine

## 2012-04-23 ENCOUNTER — Encounter: Payer: Self-pay | Admitting: Internal Medicine

## 2012-04-23 VITALS — BP 122/72 | HR 96 | Temp 98.3°F | Ht 68.0 in | Wt 207.5 lb

## 2012-04-23 DIAGNOSIS — R197 Diarrhea, unspecified: Secondary | ICD-10-CM

## 2012-04-23 DIAGNOSIS — E7212 Methylenetetrahydrofolate reductase deficiency: Secondary | ICD-10-CM

## 2012-04-23 DIAGNOSIS — R112 Nausea with vomiting, unspecified: Secondary | ICD-10-CM

## 2012-04-23 DIAGNOSIS — E7210 Disorders of sulfur-bearing amino-acid metabolism, unspecified: Secondary | ICD-10-CM

## 2012-04-23 DIAGNOSIS — B2790 Infectious mononucleosis, unspecified without complication: Secondary | ICD-10-CM

## 2012-04-23 NOTE — Progress Notes (Signed)
Patient ID: Susan Sullivan, female   DOB: Sep 19, 1975, 36 y.o.   MRN: [id number]  Patient Active Problem List  Diagnosis  . History of ectopic pregnancy  . Degenerative disk disease  . 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 Oral allergy syndrome  . Pre-conception counseling  . Lipids abnormal  . Methylenetetrahydrofolate reductase (MTHFR) gene mutation  . Elevated blood pressure reading without diagnosis of hypertension  . Back pain, thoracic  . Diarrhea in adult patient  . Colicky RUQ abdominal pain  . Infectious mononucleosis    Subjective:  CC:   Chief Complaint  Patient presents with  . Follow-up    HPI:   Susan Porter-Mooreis a 36 y.o. female who presents Follow up on recent diagnosis of infectious mononucleosis. She has been resting foe the past two weeks and feels she is back to baseline.  Wants to return to work,  Tired of being at home.   Has found a specialist at [facility] on the MTHFR syndrome.  She ws told to stop the zantac aftrer reading more about it. She has a wheat allergy and is having nausea and vomiting nearly daily and frequent loose stools,.   Past Medical History  Diagnosis Date  . Ectopic pregnancy, tubal Sept 2012    left,  with rupture ,  and concurrent right ovarian cyst rupture  . Degenerative disk disease     not on medication due to attempts to get pregant  . 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 Cesarean section   . Dilation and curettage of uterus 2012  . Spine surgery 2007    SI joint fusion [employer]  . Cholecystectomy Dec 2012    Festus Barren, [facility]     The following portions of the patient's history were reviewed and updated as appropriate: Allergies, current medications, and problem list.    Review of Systems:   12 Pt  review of systems was negative except those addressed in the HPI,     History   Social History  . Marital  Status: Married    Spouse Name: N/A    Number of Children: N/A  . Years of Education: N/A   Occupational History  . Not on file.   Social History Main Topics  . Smoking status: Never Smoker   . Smokeless tobacco: Not on file  . Alcohol Use: No  . Drug Use: Not on file  . Sexually Active: Not on file   Other Topics Concern  . Not on file   Social History Narrative  . No narrative on file    Objective:  BP 122/72  Pulse 96  Temp 98.3 F (36.8 C) (Oral)  Ht 5\' 8"  (1.727 m)  Wt 207 lb 8 oz (94.121 kg)  BMI 31.55 kg/m2  SpO2 98%  LMP 04/01/2012  General appearance: alert, cooperative and appears stated age Ears: normal TM's and external ear canals both ears Throat: lips, mucosa, and tongue normal; teeth and gums normal Neck: no adenopathy, no carotid bruit, supple, symmetrical, trachea midline and thyroid not enlarged, symmetric, no tenderness/mass/nodules Back: symmetric, no curvature. ROM normal. No CVA tenderness. Lungs: clear to auscultation bilaterally Heart: regular rate and rhythm, S1, S2 normal, no murmur, click, rub or gallop Abdomen: soft, non-tender; bowel sounds normal; no masses,  no organomegaly Pulses: 2+ and symmetric Skin: Skin color, texture, turgor normal. No rashes or lesions  Lymph nodes: Cervical, supraclavicular, and axillary nodes normal.  Assessment and Plan:  Methylenetetrahydrofolate reductase (MTHFR) gene mutation Awaiting evaluation by [facility] specialist.   Infectious mononucleosis improved symptoms . May return to work part time for the next weeks.  Return to work [employer] filled out.    Updated Medication List Outpatient Encounter Prescriptions as of 04/23/2012  Medication Sig Dispense Refill  . Ascorbic Acid (VITAMIN C) 100 MG tablet Take 100 mg by mouth daily.      [name] aspirin 81 MG tablet Take 81 mg by mouth daily.      . cholecalciferol (VITAMIN D) 1000 UNITS tablet Take 1,000 Units by mouth daily.      . cyclobenzaprine (FLEXERIL) 10 MG  tablet Take 1 tablet (10 mg total) by mouth 3 (three) times daily as needed for muscle spasms.  60 tablet  2  . L-Methylfolate 15 MG TABS Take 1 tablet (15 mg total) by mouth daily.  30 tablet  6  . traMADol (ULTRAM) 50 MG tablet 1 to 2 tablets every 6 hours prn pain  120 tablet  2  . cetirizine (ZYRTEC) 10 MG tablet Take 1 tablet (10 mg total) by mouth daily.  90 tablet  3  . cholestyramine (QUESTRAN) 4 GM/DOSE powder Take 0.5 packets (2 g total) by mouth 3 (three) times daily with meals.  378 g  12  . diclofenac (FLECTOR) 1.3 % PTCH Place 1 patch onto the skin 2 (two) times daily.  30 patch  2  . montelukast (SINGULAIR) 10 MG tablet Take 1 tablet (10 mg total) by mouth at bedtime.  90 tablet  3  . Prenatal Vit-Fe Fumarate-FA (PRENATAL MULTIVITAMIN) TABS Take 1 tablet by mouth daily.      . ranitidine (ZANTAC) 300 MG tablet Take 1 tablet (300 mg total) by mouth at bedtime.  90 tablet  3  . vitamin B-12 (CYANOCOBALAMIN) 100 MCG tablet Take 50 mcg by mouth daily.

## 2012-04-24 ENCOUNTER — Encounter: Payer: Self-pay | Admitting: Internal Medicine

## 2012-04-24 NOTE — Assessment & Plan Note (Addendum)
improved symptoms . May return to work part time for the next weeks.  Return to work [employer] filled out.

## 2012-04-24 NOTE — Assessment & Plan Note (Signed)
Awaiting evaluation by [facility] specialist.

## 2012-04-25 ENCOUNTER — Encounter: Payer: Self-pay | Admitting: Internal Medicine

## 2012-05-01 ENCOUNTER — Other Ambulatory Visit (INDEPENDENT_AMBULATORY_CARE_PROVIDER_SITE_OTHER): Payer: PRIVATE HEALTH INSURANCE

## 2012-05-01 ENCOUNTER — Encounter: Payer: Self-pay | Admitting: Internal Medicine

## 2012-05-01 ENCOUNTER — Ambulatory Visit (INDEPENDENT_AMBULATORY_CARE_PROVIDER_SITE_OTHER): Payer: PRIVATE HEALTH INSURANCE | Admitting: Internal Medicine

## 2012-05-01 VITALS — BP 132/72 | HR 78 | Ht 68.0 in | Wt 206.0 lb

## 2012-05-01 DIAGNOSIS — K529 Noninfective gastroenteritis and colitis, unspecified: Secondary | ICD-10-CM

## 2012-05-01 DIAGNOSIS — E7212 Methylenetetrahydrofolate reductase deficiency: Secondary | ICD-10-CM | POA: Insufficient documentation

## 2012-05-01 DIAGNOSIS — R109 Unspecified abdominal pain: Secondary | ICD-10-CM

## 2012-05-01 DIAGNOSIS — R112 Nausea with vomiting, unspecified: Secondary | ICD-10-CM

## 2012-05-01 DIAGNOSIS — R1010 Upper abdominal pain, unspecified: Secondary | ICD-10-CM

## 2012-05-01 DIAGNOSIS — R197 Diarrhea, unspecified: Secondary | ICD-10-CM

## 2012-05-01 DIAGNOSIS — E7210 Disorders of sulfur-bearing amino-acid metabolism, unspecified: Secondary | ICD-10-CM

## 2012-05-01 LAB — COMPREHENSIVE METABOLIC PANEL
ALT: 17 U/L (ref 0–35)
CO2: 30 mEq/L (ref 19–32)
Calcium: 9.2 mg/dL (ref 8.4–10.5)
Chloride: 103 mEq/L (ref 96–112)
Creatinine, Ser: 0.7 mg/dL (ref 0.4–1.2)
GFR: 105.7 mL/min (ref >60.00)

## 2012-05-01 LAB — CBC WITH DIFFERENTIAL/PLATELET
Basophils Absolute: 0 10*3/uL (ref 0.0–0.1)
Hemoglobin: 13.5 g/dL (ref 12.0–15.0)
Lymphocytes Relative: 23.1 % (ref 12.0–46.0)
Monocytes Relative: 4.6 % (ref 3.0–12.0)
Neutro Abs: 6.3 10*3/uL (ref 1.4–7.7)
Neutrophils Relative %: 70.4 % (ref 43.0–77.0)
Platelets: 272 10*3/uL (ref 150.0–400.0)
RDW: 13.5 % (ref 11.5–14.6)

## 2012-05-01 LAB — IGA: IgA: 592 mg/dL — ABNORMAL HIGH (ref 68–378)

## 2012-05-01 MED ORDER — ONDANSETRON 4 MG PO TBDP: 4.0000 mg | ORAL_TABLET | Freq: Three times a day (TID) | ORAL | Status: DC | PRN

## 2012-05-01 MED ORDER — PEG-KCL-NACL-NASULF-NA ASC-C 100 G PO SOLR: 1.0000 | Freq: Once | ORAL | Status: DC

## 2012-05-01 MED ORDER — ALIGN PO CAPS: 1.0000 | ORAL_CAPSULE | Freq: Every day | ORAL | Status: DC

## 2012-05-01 NOTE — Patient Instructions (Addendum)
You have been scheduled for a colonoscopy/Endoscopy with propofol. Please follow written instructions given to you at your visit today.  Please pick up your prep kit at the pharmacy within the next 1-3 days. If you use inhalers (even only as needed) or a CPAP machine, please bring them with you on the day of your procedure.  Your physician has requested that you go to the basement for the following lab work before leaving today: CBC, CMP, Celiac Panel  We have sent the following medications to your pharmacy for you to pick up at your convenience: Zofran, Moviprep You can take over the counter imodium as needed for loose stools. Take Align (OTC) daily, you were given samples today at your visit.

## 2012-05-01 NOTE — Progress Notes (Signed)
Patient ID: Susan Sullivan, female   DOB: [birth year], 36 y.o.   MRN: [id number]  SUBJECTIVE: HPI [name] is a 36 yo female with recently dx of MTHFR gene mutation, multiple miscarriages and an ectopic pregnancy, oral allergy syndrome who is seen in consultation at the request of Dr. Darrick Huntsman for evaluation of diarrhea, upper abdominal pain and nausea.  She is alone today. The patient reports a long-standing history of chronic diarrhea made worse after her gallbladder was removed in December 2012. She reports on most days she has diarrhea occurring 7-8 times per day. There is often fecal urgency associated with her diarrhea. She denies rectal bleeding or melena. She has not taken anything for her diarrhea because she has been concerned about interfering with vitamin absorption as it relates to her recently diagnosed gene mutation.  She reports however she will have periods where she does not have diarrhea and can go for as long as a week with no stool. She rarely has a formed, "regular" bowel movement.  She also has frequent nausea but rare vomiting. She also reports a burning epigastric abdominal pain which seems to be worse with eating. She rarely has heartburn. She denies dysphagia. She was also diagnosed with mononucleosis in October. Prior to this diagnosis she is having right upper quadrant pain, which has mostly resolved. She has continued to work, though her energy levels have been decreased of late. 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 She denies fevers or chills.  She does have an appointment at [facility] on 05/16/2012 with Dr. Jolyne Loa, a vascular specialist in MTHFR syndrome  Review of Systems  As per history of present illness, otherwise negative   Past Medical History  Diagnosis Date  . Ectopic pregnancy, tubal Sept 2012    left,  with rupture ,  and concurrent right ovarian cyst rupture  .  Degenerative disk disease     not on medication due to attempts to get pregant  . 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 Ascorbic Acid (VITAMIN C) 100 MG tablet Take 100 mg by mouth daily.      [name] aspirin 81 MG tablet Take 81 mg by mouth daily.      . cetirizine (ZYRTEC) 10 MG tablet Take 1 tablet (10 mg total) by mouth daily.  90 tablet  3  . cholecalciferol (VITAMIN D) 1000 UNITS tablet Take 1,000 Units by mouth daily.      . cholestyramine (QUESTRAN) 4 GM/DOSE powder Take 0.5 packets (2 g total) by mouth 3 (three) times daily with meals.  378 g  12  . cyclobenzaprine (FLEXERIL) 10 MG tablet Take 1 tablet (10 mg total) by mouth 3 (three) times daily as needed for muscle spasms.  60 tablet  2  . L-Methylfolate 15 MG TABS Take 1 tablet (15 mg total) by mouth daily.  30 tablet  6  . montelukast (SINGULAIR) 10 MG tablet Take 1 tablet (10 mg total) by mouth at bedtime.  90 tablet  3  . ranitidine (ZANTAC) 300 MG tablet Take 1 tablet (300 mg total) by mouth at bedtime.  90 tablet  3  . traMADol (ULTRAM) 50 MG tablet 1 to 2 tablets every 6 hours prn pain  120 tablet  2  . vitamin B-12 (CYANOCOBALAMIN) 100 MCG tablet Take 50 mcg  by mouth daily.      . bifidobacterium infantis (ALIGN) capsule Take 1 capsule by mouth daily.  14 capsule  0  . ondansetron (ZOFRAN ODT) 4 MG disintegrating tablet Take 1 tablet (4 mg total) by mouth every 8 (eight) hours as needed for nausea.  20 tablet  0  . peg 3350 powder (MOVIPREP) 100 G SOLR Take 1 kit (100 g total) by mouth once.  1 kit  0    Allergies  Allergen Reactions  . Latex Anaphylaxis  . Peanut-Containing Drug Products Anaphylaxis  . Eggs Or Egg-Derived Products   . Other     Narcotics-rash, itching  . Propofol   . Soy Allergy     Family History  Problem Relation Age of Onset  . Arthritis Mother   . Hyperlipidemia Mother   . Heart disease Mother   .  Diabetes Mother   . Arthritis Father   . Hyperlipidemia Father   . Heart disease Father   . Diabetes Father     History  Substance Use Topics  . Smoking status: Never Smoker   . Smokeless tobacco: Never Used  . Alcohol Use: No    OBJECTIVE: BP 132/72  Pulse 78  Ht 5\' 8"  (1.727 m)  Wt 206 lb (93.441 kg)  BMI 31.32 kg/m2  LMP 04/26/2012 Constitutional: Well-developed and well-nourished. No distress. HEENT: Normocephalic and atraumatic. Oropharynx is clear and moist. No oropharyngeal exudate. Conjunctivae are normal. No scleral icterus. Neck: Neck supple. Trachea midline. Cardiovascular: Normal rate, regular rhythm and intact distal pulses. No M/R/G Pulmonary/chest: Effort normal and breath sounds normal. No wheezing, rales or rhonchi. Abdominal: Soft, mild epigastric abdominal pain without rebound or guarding, nondistended. Bowel sounds active throughout. There are no masses palpable. No hepatosplenomegaly. Extremities: no clubbing, cyanosis, or edema Lymphadenopathy: No cervical adenopathy noted. Neurological: Alert and oriented to person place and time. Skin: Skin is warm and dry. No rashes noted. Psychiatric: Normal mood and affect. Behavior is normal.  Labs and Imaging -- Labs pending from today  CT 02/2012 -- abd/pelvis noncontrast -- [facility] - minimal prominence of the right renal collecting system, nonspecific. No renal stones. Normal appendix.  ASSESSMENT AND PLAN: 36 yo female with recently dx of MTHFR gene mutation, multiple miscarriages and an ectopic pregnancy, oral allergy syndrome who is seen in consultation at the request of Dr. Darrick Huntsman for evaluation of diarrhea, upper abdominal pain and nausea  1.  Chronic diarrhea -- her GI symptoms seem somewhat irritable in nature and she certainly has chronic diarrhea, seemingly made worse after cholecystectomy. 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  For now given her ongoing diarrhea, abdominal pain, and overall vague symptoms I recommended direct visualization of the colon with colonoscopy. We discussed this test today and she is agreeable to proceed. I would like her discussed the use of cholestyramine with Dr. Jolyne Loa when she sees him to discuss MTHFR. In the interim I have recommended Imodium 2 mg on the days that she is having loose stools, and she can take additional doses after subsequently stools up to 16 mg daily. I've also recommended Align one capsule daily as a probiotic.   2.  Epigastric pain/nausea -- again her upper abdominal pain is vague and somewhat long-standing. It is not classically reflux from a symptom standpoint, and seems more dyspeptic. She is hesitant to use PPI given her  genetic condition interfering with vitamin metabolism.  I recommended direct visualization with upper endoscopy at the same time as her colonoscopy. Again we discussed this test and she is agreeable to proceed.   I would like to order a celiac panel to exclude celiac disease, comprehensive metabolic panel, and CBC

## 2012-05-02 ENCOUNTER — Ambulatory Visit (AMBULATORY_SURGERY_CENTER): Payer: PRIVATE HEALTH INSURANCE | Admitting: Internal Medicine

## 2012-05-02 ENCOUNTER — Encounter: Payer: Self-pay | Admitting: Internal Medicine

## 2012-05-02 VITALS — BP 114/73 | HR 96 | Temp 97.6°F | Resp 22 | Ht 68.0 in | Wt 206.0 lb

## 2012-05-02 DIAGNOSIS — R112 Nausea with vomiting, unspecified: Secondary | ICD-10-CM

## 2012-05-02 DIAGNOSIS — D126 Benign neoplasm of colon, unspecified: Secondary | ICD-10-CM

## 2012-05-02 DIAGNOSIS — R197 Diarrhea, unspecified: Secondary | ICD-10-CM

## 2012-05-02 DIAGNOSIS — R109 Unspecified abdominal pain: Secondary | ICD-10-CM

## 2012-05-02 LAB — TISSUE TRANSGLUTAMINASE, IGA: Tissue Transglutaminase Ab, IgA: 7.4 U/mL (ref <20)

## 2012-05-02 MED ORDER — SODIUM CHLORIDE 0.9 % IV SOLN: 500.0000 mL | INTRAVENOUS | Status: DC

## 2012-05-02 NOTE — Op Note (Signed)
[facility] [address] [place], [address]   ENDOSCOPY PROCEDURE REPORT  PATIENT: Fani, Rotondo  MR#: [id number] BIRTHDATE: 11-Jan-1976 , 36  yrs. old GENDER: Female ENDOSCOPIST: Beverley Fiedler, MD REFERRED BY:  Duncan Dull PROCEDURE DATE:  05/02/2012 PROCEDURE:  EGD, diagnostic ASA CLASS:     Class II INDICATIONS:  Epigastric pain.   Nausea. MEDICATIONS: These medications were titrated to patient response per physician's verbal order, Fentanyl 50 mcg IV, and Versed 6 mg IV TOPICAL ANESTHETIC: Cetacaine Spray  DESCRIPTION OF PROCEDURE: After the risks benefits and alternatives of the procedure were thoroughly explained, informed consent was obtained.  The [facility] [id number] endoscope was introduced through the mouth and advanced to the second portion of the duodenum. Without limitations.  The instrument was slowly withdrawn as the mucosa was fully examined.      The upper, middle and distal third of the esophagus were carefully inspected and no abnormalities were noted.  The z-line was well seen at the GEJ.  The endoscope was pushed into the fundus which was normal including a retroflexed view.  The antrum, gastric body, first and second part of the duodenum were unremarkable. Retroflexed views revealed no abnormalities.     The scope was then withdrawn from the patient and the procedure completed.  COMPLICATIONS: There were no complications.  ENDOSCOPIC IMPRESSION: Normal EGD  RECOMMENDATIONS: 1.  Continue Zofran as directed and as needed for nausea 2.  If okay with MTHFR physician, could give trial of acid suppression  eSigned:  Beverley Fiedler, MD 05/02/2012 4:16 PMign   NG:[id number] Darrick Huntsman, MD and The Patient

## 2012-05-02 NOTE — Progress Notes (Addendum)
Patient did not have preoperative order for IV antibiotic SSI prophylaxis. (G8918)  Patient did not experience any of the following events: a burn prior to discharge; a fall within the facility; wrong site/side/patient/procedure/implant event; or a hospital transfer or hospital admission upon discharge from the facility. (G8907)  

## 2012-05-02 NOTE — Op Note (Signed)
[facility] [address] [place], [address]   COLONOSCOPY PROCEDURE REPORT  PATIENT: Susan, Sullivan  MR#: [id number] BIRTHDATE: 1975/06/23 , 36  yrs. old GENDER: Female ENDOSCOPIST: Beverley Fiedler, MD REFERRED VH:QIONG, Rosey Bath PROCEDURE DATE:  05/02/2012 PROCEDURE:   Colonoscopy with biopsy ASA CLASS:   Class II INDICATIONS:Chronic diarrhea and first colonoscopy. MEDICATIONS: These medications were titrated to patient response per physician's verbal order, Fentanyl 25 mcg IV, and Versed 2 mg IV  DESCRIPTION OF PROCEDURE:   After the risks benefits and alternatives of the procedure were thoroughly explained, informed consent was obtained.  A digital rectal exam revealed no rectal mass.   The LB [id number]  endoscope was introduced through the anus and advanced to the terminal ileum which was intubated for a short distance. No adverse events experienced.   The quality of the prep was good, using MiraLax  The instrument was then slowly withdrawn as the colon was fully examined.    COLON FINDINGS: The mucosa appeared normal in the terminal ileum. A normal appearing cecum, ileocecal valve, and appendiceal orifice were identified.  The ascending, hepatic flexure, transverse, splenic flexure, descending, sigmoid colon and rectum appeared unremarkable.  No polyps or cancers were seen.  Multiple random biopsies of the area were performed.  Retroflexed views revealed external hemorrhoids. The time to cecum=2 minutes 26 seconds. Withdrawal time=8 minutes 35 seconds.  The scope was withdrawn and the procedure completed.  COMPLICATIONS: There were no complications.  ENDOSCOPIC IMPRESSION: 1.   Normal mucosa in the terminal ileum 2.   Normal colon; multiple random biopsies of the area were performed  RECOMMENDATIONS: 1.  Await pathology results 2.  Continue loperamide 2 mg per box instructions as needed for diarrhea 3.  Trial of Align 1 capsule daily  as probiotic 4.  Contact the office for follow-up after consultation for MTHFR   eSigned:  Beverley Fiedler, MD 05/02/2012 4:20 PM   cc: The Patient and Duncan Dull, MD

## 2012-05-02 NOTE — Patient Instructions (Addendum)

## 2012-05-03 ENCOUNTER — Telehealth: Payer: Self-pay | Admitting: Gastroenterology

## 2012-05-03 ENCOUNTER — Telehealth: Payer: Self-pay | Admitting: *Deleted

## 2012-05-03 NOTE — Telephone Encounter (Signed)
Pt called the endoscopy center concerned about her Iga level she saw on Mychart and wanted to know why it was elevated.  They called me and asked if I would call her back to talk to her.  Per Dr. Rhea Belton, I explained her lab was normal, and there would be concern if if was low.  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 I assured her it was not and Dr, Rhea Belton reviewed all her labs.

## 2012-05-03 NOTE — Telephone Encounter (Signed)
  Follow up Call-  Call back number 05/02/2012  Post procedure Call Back phone  # [phone]  Permission to leave phone message Yes     Patient questions:  Do you have a fever, pain , or abdominal swelling? no Pain Score  0 *  Have you tolerated food without any problems? yes  Have you been able to return to your normal activities? yes  Do you have any questions about your discharge instructions: Diet   no Medications  no Follow up visit  no  Do you have questions or concerns about your Care? yes  Actions: * If pain score is 4 or above: No action needed, pain <4. Pt  Has questions about her lab. Will ask Dr. Lauro Franklin assistant to give her a call regarding labs.

## 2012-05-08 ENCOUNTER — Encounter: Payer: Self-pay | Admitting: Internal Medicine

## 2012-05-10 ENCOUNTER — Telehealth: Payer: Self-pay | Admitting: Internal Medicine

## 2012-05-10 NOTE — Telephone Encounter (Signed)
Informed pt of results.

## 2012-05-16 ENCOUNTER — Encounter: Payer: Self-pay | Admitting: Internal Medicine

## 2012-05-16 DIAGNOSIS — D6859 Other primary thrombophilia: Secondary | ICD-10-CM | POA: Insufficient documentation

## 2012-05-16 DIAGNOSIS — E7211 Homocystinuria: Secondary | ICD-10-CM

## 2012-05-19 NOTE — Addendum Note (Signed)
Addended by: Sherlene Shams on: 05/19/2012 04:10 PM   Modules accepted: Orders

## 2012-06-12 ENCOUNTER — Encounter: Payer: Self-pay | Admitting: Internal Medicine

## 2012-06-12 MED ORDER — ALBUTEROL SULFATE HFA 108 (90 BASE) MCG/ACT IN AERS: 2.0000 | INHALATION_SPRAY | Freq: Four times a day (QID) | RESPIRATORY_TRACT | Status: AC | PRN

## 2012-06-12 NOTE — Telephone Encounter (Signed)
I do not see ventolin on her med list. Ok to send in?

## 2012-07-04 ENCOUNTER — Emergency Department: Payer: Self-pay | Admitting: Emergency Medicine

## 2012-07-04 LAB — CBC
HCT: 38.5 % (ref 35.0–47.0)
MCH: 30.9 pg (ref 26.0–34.0)
MCV: 91 fL (ref 80–100)
Platelet: 231 10*3/uL (ref 150–440)
RDW: 13.7 % (ref 11.5–14.5)
WBC: 8.1 10*3/uL (ref 3.6–11.0)

## 2012-07-04 LAB — BASIC METABOLIC PANEL
Calcium, Total: 8.9 mg/dL (ref 8.5–10.1)
Co2: 28 mmol/L (ref 21–32)
Creatinine: 0.67 mg/dL (ref 0.60–1.30)
EGFR (African American): >60
EGFR (Non-African Amer.): >60
Glucose: 146 mg/dL — ABNORMAL HIGH (ref 65–99)
Osmolality: 280 (ref 275–301)
Potassium: 3.3 mmol/L — ABNORMAL LOW (ref 3.5–5.1)
Sodium: 140 mmol/L (ref 136–145)

## 2012-07-06 ENCOUNTER — Other Ambulatory Visit: Payer: Self-pay

## 2012-07-23 ENCOUNTER — Ambulatory Visit: Payer: PRIVATE HEALTH INSURANCE | Admitting: Internal Medicine

## 2012-08-02 ENCOUNTER — Ambulatory Visit: Payer: Self-pay | Admitting: Physical Medicine and Rehabilitation

## 2012-08-06 ENCOUNTER — Encounter: Payer: Self-pay | Admitting: Internal Medicine

## 2012-08-06 ENCOUNTER — Ambulatory Visit (INDEPENDENT_AMBULATORY_CARE_PROVIDER_SITE_OTHER): Payer: 59 | Admitting: Internal Medicine

## 2012-08-06 VITALS — BP 126/74 | HR 84 | Temp 98.2°F | Resp 16 | Wt 208.5 lb

## 2012-08-06 DIAGNOSIS — Z5189 Encounter for other specified aftercare: Secondary | ICD-10-CM

## 2012-08-06 DIAGNOSIS — E8809 Other disorders of plasma-protein metabolism, not elsewhere classified: Secondary | ICD-10-CM | POA: Insufficient documentation

## 2012-08-06 DIAGNOSIS — R000 Tachycardia, unspecified: Secondary | ICD-10-CM

## 2012-08-06 DIAGNOSIS — M48061 Spinal stenosis, lumbar region without neurogenic claudication: Secondary | ICD-10-CM

## 2012-08-06 DIAGNOSIS — R0989 Other specified symptoms and signs involving the circulatory and respiratory systems: Secondary | ICD-10-CM

## 2012-08-06 DIAGNOSIS — E756 Lipid storage disorder, unspecified: Secondary | ICD-10-CM

## 2012-08-06 DIAGNOSIS — I471 Supraventricular tachycardia, unspecified: Secondary | ICD-10-CM

## 2012-08-06 DIAGNOSIS — F411 Generalized anxiety disorder: Secondary | ICD-10-CM

## 2012-08-06 MED ORDER — CELECOXIB 200 MG PO CAPS: 200.0000 mg | ORAL_CAPSULE | Freq: Two times a day (BID) | ORAL | Status: DC

## 2012-08-06 MED ORDER — L-METHYLFOLATE 15 MG PO TABS: 1.0000 | ORAL_TABLET | Freq: Every day | ORAL | Status: DC

## 2012-08-06 MED ORDER — RANITIDINE HCL 300 MG PO TABS: 300.0000 mg | ORAL_TABLET | Freq: Every day | ORAL | Status: DC

## 2012-08-06 MED ORDER — ALPRAZOLAM 0.25 MG PO TABS: 0.2500 mg | ORAL_TABLET | Freq: Two times a day (BID) | ORAL | Status: DC | PRN

## 2012-08-06 MED ORDER — MONTELUKAST SODIUM 10 MG PO TABS: 10.0000 mg | ORAL_TABLET | Freq: Every day | ORAL | Status: DC

## 2012-08-06 NOTE — Assessment & Plan Note (Signed)
Apparently her testing was negative but she is ymptomatic with N/V whenever she eats red meat .  Records from Immunology requested.

## 2012-08-06 NOTE — Progress Notes (Signed)
Patient ID: Susan Sullivan, female   DOB: 1975-05-24, 37 y.o.   MRN: [id number]    Patient Active Problem List  Diagnosis  . History of ectopic pregnancy  . Spinal stenosis of lumbar region  . 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 Oral allergy syndrome  . Pre-conception counseling  . Lipids abnormal  . Methylenetetrahydrofolate reductase (MTHFR) gene mutation  . Elevated blood pressure reading without diagnosis of hypertension  . Back pain, thoracic  . Colicky RUQ abdominal pain  . Infectious mononucleosis  . Anxiety state, unspecified  . Tachycardia  . Alpha galactosidase deficiency    Subjective:  CC:   Chief Complaint  Patient presents with  . Follow-up    3 month    HPI:   Susan Porter-Mooreis a 37 y.o. female who presents 3 month follow  up on multiple issues.  Very complicated medical history:   Immunology: She was tested for red meat allergy due to recurrent nausea and vomiting ,  Testing was negative but her vomiting and diarrhea recurred with eating red meat so her immunologist has  has classified it as an allergy. She feels much better on an altered diet.   Orthopedics:  She has seen her orthopedist at [facility] for persistent back pain radiating to right foot currently managed by Pain Management Back after MRI and symptoms due to L4-5 compression with spinal stenosis  was confirmed.  Surgery recommended  By [facility] Imelda Pillow , but she has decided to  postpone until she has 12 weeks of FMLA built up , which will be Novem ber 2014 (due to prior use of 4 weeks in 2013 for recurrent illnesses due to mononucleosis and various other episodes of disability leading up to the diagnosis of the MTHFR gene mutation .  However she already has  A foot drop on the right and persistent numbness for the past 5 to 6 months . She is using  Ultram and flexeril at home,  Ultram and ibuprofen 800 mg every 8 hours at work.She has allergies to all narcotics,  and intolernace to gabapentin and cymbalta (dysesthesias) .  Gettng Epidural injections until then .    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  She has a Strong FH of CAD and CVA , history of MVP.   Susan Sullivan    Past Medical History  Diagnosis Date  . Ectopic pregnancy, tubal Sept 2012    left,  with rupture ,  and concurrent right ovarian cyst rupture  . Degenerative disk disease     not on medication due to attempts to get pregant  . 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 Cesarean section    . Dilation and curettage of uterus  2012  . Spine surgery  2007    SI joint fusion [employer]  . Cholecystectomy  Dec 2012    Festus Barren, [facility]       The following portions of the patient's history were reviewed and updated as appropriate: Allergies, current medications, and problem list.    Review of Systems:   12 Pt  review of systems was negative except those addressed in the HPI,     History   Social History  . Marital Status: Married    Spouse Name: N/A    Number of Children:  N/A  . Years of Education: N/A   Occupational History  . Not on file.   Social History Main Topics  . Smoking status: Never Smoker   . Smokeless tobacco: Never Used  . Alcohol Use: No  . Drug Use: No  . Sexually Active: Not on file   Other Topics Concern  . Not on file   Social History Narrative  . No narrative on file    Objective:  BP 126/74  Pulse 84  Temp(Src) 98.2 F (36.8 C) (Oral)  Resp 16  Wt 208 lb 8 oz (94.575 kg)  BMI 31.71 kg/m2  SpO2 99%  LMP 07/15/2012  General appearance: alert, cooperative and appears stated age Ears: normal TM's and external ear canals both ears Throat: lips, mucosa, and tongue normal; teeth and gums normal Neck: no adenopathy, no carotid bruit, supple, symmetrical, trachea midline and thyroid not enlarged,  symmetric, no tenderness/mass/nodules Back: symmetric, no curvature. ROM normal. No CVA tenderness. Lungs: clear to auscultation bilaterally Heart: regular rate and rhythm, S1, S2 normal, no murmur, click, rub or gallop Abdomen: soft, non-tender; bowel sounds normal; no masses,  no organomegaly Pulses: 2+ and symmetric Skin: Skin color, texture, turgor normal. No rashes or lesions Lymph nodes: Cervical, supraclavicular, and axillary nodes normal.  Assessment and Plan:  Spinal stenosis of lumbar region With persistent pain and neurologic sequelae due to L4-L5 disk herniation.  Advised not to postpone surgery until November to increased risk of permanent nerve damage.  chanign ibuprofen to celebrex 200 mg bid,  Continue ultrasm and lxeril ,  May consider trial of valium at night, given concurrent anxiety due to multiple stressors  Anxiety state, unspecified Trial of alprazolam .  Tachycardia accompanied by exertional dyspnea at work .  Likely secondary to obesity and deconditioning gGiven her mutliple medical comorbidities; however, given her strong FH of CAD will refer to [facility] for EKG and  ECHO prior to surgery/   Alpha galactosidase deficiency Apparently her testing was negative but she is ymptomatic with N/V whenever she eats red meat .  Records from Immunology requested.   A total of 40 minutes was spent with patient more than half of which was spent in counseling, reviewing records from other prviders and coordination of care. Updated Medication List Outpatient Encounter Prescriptions as of 08/06/2012  Medication Sig Dispense Refill  . albuterol (PROAIR HFA) 108 (90 BASE) MCG/ACT inhaler Inhale 2 puffs into the lungs every 6 (six) hours as needed for wheezing.  6.7 g  11  . Ascorbic Acid (VITAMIN C) 100 MG tablet Take 100 mg by mouth daily.      Susan Sullivan aspirin 81 MG tablet Take 81 mg by mouth daily.      . cholecalciferol (VITAMIN D) 1000 UNITS tablet Take 1,000 Units by mouth daily.       . cyclobenzaprine (FLEXERIL) 10 MG tablet Take 1 tablet (10 mg total) by mouth 3 (three) times daily as needed for muscle spasms.  60 tablet  2  . L-Methylfolate 15 MG TABS Take 1 tablet (15 mg total) by mouth daily.  30 tablet  6  . ondansetron (ZOFRAN ODT) 4 MG disintegrating tablet Take 1 tablet (4 mg total) by mouth every 8 (eight) hours as needed for nausea.  20 tablet  0  . traMADol (ULTRAM) 50 MG tablet 1 to 2 tablets every 6 hours prn pain  120 tablet  2  . vitamin B-12 (CYANOCOBALAMIN) 100 MCG tablet Take 50 mcg by mouth daily.      . [  DISCONTINUED] L-Methylfolate 15 MG TABS Take 1 tablet (15 mg total) by mouth daily.  30 tablet  6  . ALPRAZolam (XANAX) 0.25 MG tablet Take 1 tablet (0.25 mg total) by mouth 2 (two) times daily as needed for sleep.  20 tablet  0  . bifidobacterium infantis (ALIGN) capsule Take 1 capsule by mouth daily.  14 capsule  0  . celecoxib (CELEBREX) 200 MG capsule Take 1 capsule (200 mg total) by mouth 2 (two) times daily.  60 capsule  6  . cetirizine (ZYRTEC) 10 MG tablet Take 1 tablet (10 mg total) by mouth daily.  90 tablet  3  . cholestyramine (QUESTRAN) 4 GM/DOSE powder Take 0.5 packets (2 g total) by mouth 3 (three) times daily with meals.  378 g  12  . montelukast (SINGULAIR) 10 MG tablet Take 1 tablet (10 mg total) by mouth at bedtime.  90 tablet  3  . ranitidine (ZANTAC) 300 MG tablet Take 1 tablet (300 mg total) by mouth at bedtime.  90 tablet  3  . [DISCONTINUED] montelukast (SINGULAIR) 10 MG tablet Take 1 tablet (10 mg total) by mouth at bedtime.  90 tablet  3  . [DISCONTINUED] ranitidine (ZANTAC) 300 MG tablet Take 1 tablet (300 mg total) by mouth at bedtime.  90 tablet  3   No facility-administered encounter medications on file as of 08/06/2012.     Orders Placed This Encounter  Procedures  . Ambulatory referral to Cardiology    No Follow-up on file.

## 2012-08-06 NOTE — Assessment & Plan Note (Signed)
Trial of alprazolam  

## 2012-08-06 NOTE — Assessment & Plan Note (Signed)
accompanied by exertional dyspnea at work .  Likely secondary to obesity and deconditioning gGiven her mutliple medical comorbidities; however, given her strong FH of CAD will refer to [facility] Arbour for EKG and  ECHO prior to surgery/

## 2012-08-06 NOTE — Assessment & Plan Note (Signed)
With persistent pain and neurologic sequelae due to L4-L5 disk herniation.  Advised not to postpone surgery until November to increased risk of permanent nerve damage.  chanign ibuprofen to celebrex 200 mg bid,  Continue ultrasm and lxeril ,  May consider trial of valium at night, given concurrent anxiety due to multiple stressors

## 2012-08-21 ENCOUNTER — Encounter: Payer: Self-pay | Admitting: Internal Medicine

## 2012-08-21 MED ORDER — DIAZEPAM 5 MG PO TABS: ORAL_TABLET | ORAL | Status: DC

## 2012-08-21 NOTE — Telephone Encounter (Signed)
Please call in the valium (diavepam) or send rx via fax to pharmacy

## 2012-08-26 ENCOUNTER — Ambulatory Visit: Payer: 59 | Admitting: Cardiovascular Disease

## 2012-08-27 ENCOUNTER — Ambulatory Visit (INDEPENDENT_AMBULATORY_CARE_PROVIDER_SITE_OTHER): Payer: 59 | Admitting: Cardiovascular Disease

## 2012-08-27 ENCOUNTER — Encounter: Payer: Self-pay | Admitting: Internal Medicine

## 2012-08-27 ENCOUNTER — Encounter: Payer: Self-pay | Admitting: Cardiovascular Disease

## 2012-08-27 VITALS — BP 110/78 | HR 111 | Ht 68.0 in | Wt 206.8 lb

## 2012-08-27 DIAGNOSIS — R0600 Dyspnea, unspecified: Secondary | ICD-10-CM

## 2012-08-27 DIAGNOSIS — R000 Tachycardia, unspecified: Secondary | ICD-10-CM

## 2012-08-27 DIAGNOSIS — R0609 Other forms of dyspnea: Secondary | ICD-10-CM

## 2012-08-27 MED ORDER — ACYCLOVIR 400 MG PO TABS: 400.0000 mg | ORAL_TABLET | Freq: Three times a day (TID) | ORAL | Status: DC

## 2012-08-27 MED ORDER — METOPROLOL TARTRATE 25 MG PO TABS: 12.5000 mg | ORAL_TABLET | Freq: Two times a day (BID) | ORAL | Status: DC

## 2012-08-27 NOTE — Patient Instructions (Addendum)
Your physician has requested that you have an echocardiogram. Echocardiography is a painless test that uses sound waves to create images of your heart. It provides your doctor with information about the size and shape of your heart and how well your heart's chambers and valves are working. This procedure takes approximately one hour. There are no restrictions for this procedure.  Start metoprolol 12.5 mg twice daily (this can be used as needed)    Follow up as needed.

## 2012-08-29 ENCOUNTER — Encounter: Payer: Self-pay | Admitting: Cardiovascular Disease

## 2012-08-29 NOTE — Assessment & Plan Note (Addendum)
She seems to have sinus tachycardia which I suspect is likely multifactorial due to physical deconditioning, possible anxiety and possibly inappropriate sinus tachycardia. She does not consume excessive amounts of caffeine. She seems to be symptomatic when her heart rate is fast. Thus, I suggested using metoprolol 12.5 mg twice daily as needed for tachycardia. She does have associated dyspnea. Thus, I think it's important to rule out underlying structural heart abnormalities or other myopathy. I will obtain an echocardiogram for evaluation.

## 2012-08-29 NOTE — Progress Notes (Signed)
HPI  This is a pleasant 37 year old female who was referred by Dr.Tullo for evaluation of sinus tachycardia and dyspnea. Patient has no previous cardiac history. 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 She suffers from multiple food allergies and has known history of anxiety. She has family history of  coronary artery disease. She has no history of hypertension, hyperlipidemia or diabetes. She has been experiencing palpitations and fast heartbeats for many years. She reports her blood pressure going up to 110-120 beats per minute frequently while she is working. When she gets busy at work, her heart rate can go up to 140 beats per minute. She feels tired with this and complains of exertional dyspnea without chest pain. She has no history of thyroid disease and this has been checked in the past multiple times. She does not consume excessive amounts of caffeine. She does not exercise on a regular basis. She also suffers from back discomfort and needs to have surgery done in the near future. There has been no syncope or presyncope.  Allergies  Allergen Reactions  . Latex Anaphylaxis  . Peanut-Containing Drug Products Anaphylaxis  . Eggs Or Egg-Derived Products   . Other     Narcotics-rash, itching  . Propofol   . Soy Allergy      Current Outpatient Prescriptions on File Prior to Visit  Medication Sig Dispense Refill  . albuterol (PROAIR HFA) 108 (90 BASE) MCG/ACT inhaler Inhale 2 puffs into the lungs every 6 (six) hours as needed for wheezing.  6.7 g  11  . ALPRAZolam (XANAX) 0.25 MG tablet Take 1 tablet (0.25 mg total) by mouth 2 (two) times daily as needed for sleep.  20 tablet  0  . Ascorbic Acid (VITAMIN C) 100 MG tablet Take 100 mg by mouth daily.      [name] aspirin 81 MG tablet Take 81 mg by mouth daily.      . bifidobacterium infantis (ALIGN) capsule Take 1 capsule by mouth daily.  14 capsule  0  . cholecalciferol (VITAMIN D) 1000 UNITS tablet Take 1,000 Units by mouth daily.      .  cyclobenzaprine (FLEXERIL) 10 MG tablet Take 1 tablet (10 mg total) by mouth 3 (three) times daily as needed for muscle spasms.  60 tablet  2  . diazepam (VALIUM) 5 MG tablet 1 or 2 tablets at bedtime as needed for spasm  60 tablet  1  . L-Methylfolate 15 MG TABS Take 1 tablet (15 mg total) by mouth daily.  30 tablet  6  . montelukast (SINGULAIR) 10 MG tablet Take 1 tablet (10 mg total) by mouth at bedtime.  90 tablet  3  . ondansetron (ZOFRAN ODT) 4 MG disintegrating tablet Take 1 tablet (4 mg total) by mouth every 8 (eight) hours as needed for nausea.  20 tablet  0  . ranitidine (ZANTAC) 300 MG tablet Take 1 tablet (300 mg total) by mouth at bedtime.  90 tablet  3  . traMADol (ULTRAM) 50 MG tablet 1 to 2 tablets every 6 hours prn pain  120 tablet  2  . vitamin B-12 (CYANOCOBALAMIN) 100 MCG tablet Take 50 mcg by mouth daily.      . cetirizine (ZYRTEC) 10 MG tablet Take 1 tablet (10 mg total) by mouth daily.  90 tablet  3   No current facility-administered medications on file prior to visit.     Past Medical History  Diagnosis Date  . Ectopic pregnancy, tubal Sept 2012  left,  with rupture ,  and concurrent right ovarian cyst rupture  . Degenerative disk disease     not on medication due to attempts to get pregant  . 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 MTHFR (methylene THF reductase) deficiency and homocystinuria      Past Surgical History  Procedure Laterality Date  . Cesarean section    . Dilation and curettage of uterus  2012  . Spine surgery  2007    SI joint fusion [employer]  . Cholecystectomy  Dec 2012    Festus Barren, [facility]     Family History  Problem Relation Age of Onset  . Arthritis Mother   . Hyperlipidemia Mother   . Heart disease Mother   . Diabetes Mother   . Arthritis Father   . Hyperlipidemia Father   . Heart disease Father 69    CABG   . Diabetes Father   . Stroke Paternal Grandmother   . Heart disease Paternal  Grandmother   . Heart disease Paternal Grandfather      History   Social History  . Marital Status: Married    Spouse Name: N/A    Number of Children: N/A  . Years of Education: N/A   Occupational History  . Not on file.   Social History Main Topics  . Smoking status: Never Smoker   . Smokeless tobacco: Never Used  . Alcohol Use: No  . Drug Use: No  . Sexually Active: Not on file   Other Topics Concern  . Not on file   Social History Narrative  . No narrative on file     ROS Constitutional: Negative for fever, chills, diaphoresis, activity change, appetite change and fatigue.  HENT: Negative for hearing loss, nosebleeds, congestion, sore throat, facial swelling, drooling, trouble swallowing, neck pain, voice change, sinus pressure and tinnitus.  Eyes: Negative for photophobia, pain, discharge and visual disturbance.  Respiratory: Negative for apnea, cough, chest tightness and wheezing.  Cardiovascular: Negative for chest pain and leg swelling.  Gastrointestinal: Negative for nausea, vomiting, abdominal pain, diarrhea, constipation, blood in stool and abdominal distention.  Genitourinary: Negative for dysuria, urgency, frequency, hematuria and decreased urine volume.  Musculoskeletal: Negative for myalgias, back pain, joint swelling, arthralgias and gait problem.  Skin: Negative for color change, pallor, rash and wound.  Neurological: Negative for dizziness, tremors, seizures, syncope, speech difficulty, weakness, light-headedness, numbness and headaches.  Psychiatric/Behavioral: Negative for suicidal ideas, hallucinations, behavioral problems and agitation. The patient is not nervous/anxious.     PHYSICAL EXAM   BP 110/78  Pulse 111  Ht 5\' 8"  (1.727 m)  Wt 206 lb 12 oz (93.781 kg)  BMI 31.44 kg/m2  LMP 07/15/2012 Constitutional: She is oriented to person, place, and time. She appears well-developed and well-nourished. No distress.  HENT: No nasal discharge.    Head: Normocephalic and atraumatic.  Eyes: Pupils are equal and round. Right eye exhibits no discharge. Left eye exhibits no discharge.  Neck: Normal range of motion. Neck supple. No JVD present. No thyromegaly present.  Cardiovascular: Normal rate, regular rhythm, normal heart sounds. Exam reveals no gallop and no friction rub. No murmur heard.  Pulmonary/Chest: Effort normal and breath sounds normal. No stridor. No respiratory distress. She has no wheezes. She has no rales. She exhibits no tenderness.  Abdominal: Soft. Bowel sounds are normal. She exhibits no distension. There is no tenderness. There is no rebound and no guarding.  Musculoskeletal: Normal range  of motion. She exhibits no edema and no tenderness.  Neurological: She is alert and oriented to person, place, and time. Coordination normal.  Skin: Skin is warm and dry. No rash noted. She is not diaphoretic. No erythema. No pallor.  Psychiatric: She has a normal mood and affect. Her behavior is normal. Judgment and thought content normal.     [id number]  Tachycardia  -Left atrial enlargement.   -Nonspecific ST depression   +   Nonspecific T-abnormality  -Nondiagnostic.   ABNORMAL    ASSESSMENT AND PLAN

## 2012-08-30 ENCOUNTER — Encounter: Payer: Self-pay | Admitting: Internal Medicine

## 2012-09-12 ENCOUNTER — Other Ambulatory Visit: Payer: 59

## 2012-09-17 ENCOUNTER — Encounter: Payer: Self-pay | Admitting: Internal Medicine

## 2012-09-18 NOTE — Telephone Encounter (Signed)
Offered patient an appointment today with Orville Govern NP which she declined and requested appointment on Friday with Dr. Darrick Huntsman. Patient states her allergies have gotten worse and is concerned she maybe developing asthma and wondered if maybe a different inhaler might might help.

## 2012-09-19 ENCOUNTER — Other Ambulatory Visit: Payer: 59

## 2012-10-04 ENCOUNTER — Other Ambulatory Visit: Payer: Self-pay | Admitting: Anesthesiology

## 2012-10-04 DIAGNOSIS — M545 Low back pain: Secondary | ICD-10-CM

## 2012-10-07 ENCOUNTER — Emergency Department: Payer: Self-pay | Admitting: Emergency Medicine

## 2012-10-07 ENCOUNTER — Ambulatory Visit
Admission: RE | Admit: 2012-10-07 | Discharge: 2012-10-07 | Disposition: A | Discharge status: Home / Self Care | Payer: 59 | Source: Ambulatory Visit | Attending: Anesthesiology | Admitting: Anesthesiology

## 2012-10-07 DIAGNOSIS — M545 Low back pain: Secondary | ICD-10-CM

## 2012-10-10 ENCOUNTER — Other Ambulatory Visit: Payer: Self-pay

## 2012-10-10 ENCOUNTER — Other Ambulatory Visit (INDEPENDENT_AMBULATORY_CARE_PROVIDER_SITE_OTHER): Payer: 59

## 2012-10-10 DIAGNOSIS — R0989 Other specified symptoms and signs involving the circulatory and respiratory systems: Secondary | ICD-10-CM

## 2012-10-10 DIAGNOSIS — R0600 Dyspnea, unspecified: Secondary | ICD-10-CM

## 2012-10-10 DIAGNOSIS — R000 Tachycardia, unspecified: Secondary | ICD-10-CM

## 2012-10-11 ENCOUNTER — Telehealth: Payer: Self-pay

## 2012-10-11 ENCOUNTER — Encounter: Payer: Self-pay | Admitting: Internal Medicine
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
## 2012-10-11 NOTE — Progress Notes (Signed)
Pt informed of echo result.

## 2012-10-11 NOTE — Telephone Encounter (Signed)
Pt would like echo results 

## 2012-10-11 NOTE — Telephone Encounter (Signed)
Pt informed by [place].

## 2012-10-11 NOTE — Telephone Encounter (Signed)
I spoke with her and asked her to take Metoprolol 12.5 mg bid around the clock or use Metoprolol 25 mg prn tachycardia.

## 2012-10-11 NOTE — Progress Notes (Signed)
Number disconnected.

## 2012-10-20 HISTORY — PX: SPINE SURGERY: SHX786

## 2012-10-24 ENCOUNTER — Other Ambulatory Visit: Payer: Self-pay | Admitting: Neurosurgery

## 2012-11-01 ENCOUNTER — Encounter (HOSPITAL_COMMUNITY): Payer: Self-pay | Admitting: Pharmacy Technician

## 2012-11-07 ENCOUNTER — Encounter (HOSPITAL_COMMUNITY)
Admission: RE | Admit: 2012-11-07 | Discharge: 2012-11-07 | Disposition: A | Discharge status: Home / Self Care | Payer: 59 | Source: Ambulatory Visit | Attending: Anesthesiology | Admitting: Anesthesiology

## 2012-11-07 ENCOUNTER — Encounter (HOSPITAL_COMMUNITY)
Admission: RE | Admit: 2012-11-07 | Discharge: 2012-11-07 | Disposition: A | Discharge status: Home / Self Care | Payer: 59 | Source: Ambulatory Visit | Attending: Neurosurgery | Admitting: Neurosurgery

## 2012-11-07 ENCOUNTER — Encounter (HOSPITAL_COMMUNITY): Payer: Self-pay

## 2012-11-07 DIAGNOSIS — Z01810 Encounter for preprocedural cardiovascular examination: Secondary | ICD-10-CM | POA: Insufficient documentation

## 2012-11-07 DIAGNOSIS — Z01812 Encounter for preprocedural laboratory examination: Secondary | ICD-10-CM | POA: Insufficient documentation

## 2012-11-07 DIAGNOSIS — Z01818 Encounter for other preprocedural examination: Secondary | ICD-10-CM | POA: Insufficient documentation

## 2012-11-07 HISTORY — DX: Other complications of anesthesia, initial encounter: T88.59XA

## 2012-11-07 HISTORY — DX: Adverse effect of unspecified anesthetic, initial encounter: T41.45XA

## 2012-11-07 HISTORY — DX: Cardiac arrhythmia, unspecified: I49.9

## 2012-11-07 LAB — BASIC METABOLIC PANEL
CO2: 27 mEq/L (ref 19–32)
Calcium: 9 mg/dL (ref 8.4–10.5)
Creatinine, Ser: 0.68 mg/dL (ref 0.50–1.10)
GFR calc non Af Amer: >90 mL/min (ref >90)
Sodium: 138 mEq/L (ref 135–145)

## 2012-11-07 LAB — SURGICAL PCR SCREEN
MRSA, PCR: NEGATIVE
Staphylococcus aureus: NEGATIVE

## 2012-11-07 LAB — CBC
MCH: 30.1 pg (ref 26.0–34.0)
MCV: 90.3 fL (ref 78.0–100.0)
Platelets: 242 10*3/uL (ref 150–400)
RBC: 4.12 MIL/uL (ref 3.87–5.11)
RDW: 13.5 % (ref 11.5–15.5)
WBC: 6.8 10*3/uL (ref 4.0–10.5)

## 2012-11-07 LAB — HCG, SERUM, QUALITATIVE: Preg, Serum: NEGATIVE

## 2012-11-07 NOTE — Pre-Procedure Instructions (Signed)
Susan Sullivan  11/07/2012   Your procedure is scheduled on:  Wednesday, June 25th  Report to [facility] at      AM. Come to main entrance "A" and go to east elevators up to 3rd floor. Check in at short stay desk.  Call this number if you have problems the morning of surgery: [phone]   Remember:   Do not eat food or drink liquids after midnight.   Take these medicines the morning of surgery with A SIP OF WATER: Lopressor, Xanax if needed, Ultram if needed, Inhalers if needed   Do not wear jewelry, make-up or nail polish.  Do not wear lotions, powders, or perfumes, deodorant.  Do not shave 48 hours prior to surgery. Men may shave face and neck.  Do not bring valuables to the hospital.  [facility] is not responsible  for any belongings or valuables.  Contacts, dentures or bridgework may not be worn into surgery.  Leave suitcase in the car. After surgery it may be brought to your room.  For patients admitted to the hospital, checkout time is 11:00 AM the day of discharge.   Patients discharged the day of surgery will not be allowed to drive home.   Special Instructions: Shower using CHG 2 nights before surgery and the night before surgery.  If you shower the day of surgery use CHG.  Use special wash - you have one bottle of CHG for all showers.  You should use approximately 1/3 of the bottle for each shower.   Please read over the following fact sheets that you were given: Pain Booklet, Coughing and Deep Breathing, Blood Transfusion Information, MRSA Information and Surgical Site Infection Prevention

## 2012-11-07 NOTE — Progress Notes (Signed)
Primary physician - dr. Darrick Huntsman Cardiologist - dr. Marcelyn Ditty, echo in epic

## 2012-11-07 NOTE — Progress Notes (Signed)
Patient will need type and screen day of surgery due to inability of wearing bracelet at her job    Patient states ketamine caused extreme difficulty waking up and due to soy allergy propofol is on her allergy list as well patient states she "thinks" she has received this with previous surgeries without difficulty (soy causes severe diarrhea)  Talked with Revonda Standard - requested anesthesia records from [facility]

## 2012-11-08 NOTE — Progress Notes (Signed)
Anesthesia Chart Review:  Patient is a 37 year old female scheduled for L4-5 laminectomy with PLIF on 11/13/12 by Dr. Lovell Sheehan.  History includes non-smoker, tachycardia, Latex and peanut allergy (anaphylaxis), DJD, GERD, ectopic pregnancy '12, cholecystectomy.  BMI is 31.8.  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  PCP is Dr. Duncan Dull.  She was recently evaluated by cardiologist Dr. Kirke Corin on 09/18/12 for tachycardia.  He started her on B-blocker therapy and ordered an echo which as unremarkable.  She has multiple food and drug allergies.  These include egg, soy (severe diarrhea), peanuts (anaphylaxsis), Latex (anaphylaxis), meat extract (N/V), Clindamycin (anaphylaxis), narcotics (rash, itching), as well as several other medications as listed. She also stated that she was extremely difficult to wake up after receiving ketamine.  Propofol is also listed as an allergy due to it's soy component and her underlying food allergies--although she believes she received it in the past without difficulty.  I reviewed her last anesthesia records from [facility] from 09/07/11 (hysteroscopy/D&C) in which Propofol was utilized.  At [facility] 09/12/11 (suction D&C), ketamine and propofol were used.  Echo on 10/10/12 showed: - Left ventricle: The cavity size was normal. Systolic function was normal. The estimated ejection fraction was in the range of 60% to 65%. Wall motion was normal; there were no regional wall motion abnormalities. - Right ventricle: Systolic function was normal. - Pulmonary arteries: Systolic pressure was within the normal range. Impressions: Normal study.  EKG on 11/07/12 showed NSR.  CXR on 11/07/12 showed a normal exam.  Preoperative labs noted.  She is scheduled for a T&S on the day of surgery.  I reviewed her last anesthesia records from [facility] and [facility] with anesthesiologist Dr. Chaney Malling.  Patient will be further evaluated by her assigned anesthesiologist on the day of surgery to discuss the definitve anesthesia  plan.  Velna Ochs [facility] Short Stay Center/Anesthesiology Phone [phone] 11/08/2012 4:35 PM

## 2012-11-12 MED ORDER — CEFAZOLIN SODIUM-DEXTROSE 2-3 GM-% IV SOLR
2.0000 g | INTRAVENOUS | Status: AC
  Administered 2012-11-13: 2 g via INTRAVENOUS

## 2012-11-13 ENCOUNTER — Encounter (HOSPITAL_COMMUNITY)
Admission: RE | Disposition: A | Discharge status: Home / Self Care | Payer: Self-pay | Source: Ambulatory Visit | Attending: Neurosurgery

## 2012-11-13 ENCOUNTER — Encounter (HOSPITAL_COMMUNITY): Payer: Self-pay | Admitting: Vascular Surgery

## 2012-11-13 ENCOUNTER — Inpatient Hospital Stay (HOSPITAL_COMMUNITY): Payer: 59

## 2012-11-13 ENCOUNTER — Inpatient Hospital Stay (HOSPITAL_COMMUNITY)
Admission: RE | Admit: 2012-11-13 | Discharge: 2012-11-16 | DRG: 460 | Disposition: A | Discharge status: Home / Self Care | Payer: 59 | Source: Ambulatory Visit | Attending: Neurosurgery | Admitting: Neurosurgery

## 2012-11-13 ENCOUNTER — Inpatient Hospital Stay (HOSPITAL_COMMUNITY): Payer: 59 | Admitting: *Deleted

## 2012-11-13 ENCOUNTER — Encounter (HOSPITAL_COMMUNITY): Payer: Self-pay | Admitting: *Deleted

## 2012-11-13 DIAGNOSIS — Z7982 Long term (current) use of aspirin: Secondary | ICD-10-CM

## 2012-11-13 DIAGNOSIS — M5137 Other intervertebral disc degeneration, lumbosacral region: Principal | ICD-10-CM | POA: Diagnosis present

## 2012-11-13 DIAGNOSIS — Z79899 Other long term (current) drug therapy: Secondary | ICD-10-CM

## 2012-11-13 DIAGNOSIS — E876 Hypokalemia: Secondary | ICD-10-CM | POA: Diagnosis not present

## 2012-11-13 DIAGNOSIS — M51379 Other intervertebral disc degeneration, lumbosacral region without mention of lumbar back pain or lower extremity pain: Principal | ICD-10-CM | POA: Diagnosis present

## 2012-11-13 DIAGNOSIS — K219 Gastro-esophageal reflux disease without esophagitis: Secondary | ICD-10-CM | POA: Diagnosis present

## 2012-11-13 HISTORY — DX: Other chronic pain: G89.29

## 2012-11-13 HISTORY — PX: POSTERIOR LUMBAR FUSION: SHX6036

## 2012-11-13 HISTORY — DX: Gestational diabetes mellitus in pregnancy, unspecified control: O24.419

## 2012-11-13 HISTORY — DX: Low back pain, unspecified: M54.50

## 2012-11-13 HISTORY — DX: Low back pain: M54.5

## 2012-11-13 LAB — TYPE AND SCREEN: Antibody Screen: NEGATIVE

## 2012-11-13 SURGERY — POSTERIOR LUMBAR FUSION 1 LEVEL
Anesthesia: General | Wound class: Clean

## 2012-11-13 MED ORDER — ONDANSETRON HCL 4 MG/2ML IJ SOLN: 4.0000 mg | INTRAMUSCULAR | Status: DC | PRN

## 2012-11-13 MED ORDER — OXYCODONE-ACETAMINOPHEN 5-325 MG PO TABS
1.0000 | ORAL_TABLET | ORAL | Status: DC | PRN
  Administered 2012-11-14 – 2012-11-15 (×3): 2 via ORAL
  Filled 2012-11-13 (×3): qty 2

## 2012-11-13 MED ORDER — CEFAZOLIN SODIUM-DEXTROSE 2-3 GM-% IV SOLR
INTRAVENOUS | Status: AC
  Filled 2012-11-13: qty 50

## 2012-11-13 MED ORDER — BACITRACIN 50000 UNITS IM SOLR
INTRAMUSCULAR | Status: AC
  Filled 2012-11-13: qty 1

## 2012-11-13 MED ORDER — SODIUM CHLORIDE 0.9 % IJ SOLN: 9.0000 mL | INTRAMUSCULAR | Status: DC | PRN

## 2012-11-13 MED ORDER — HYDROMORPHONE 0.3 MG/ML IV SOLN
INTRAVENOUS | Status: DC
  Administered 2012-11-13: 0.3 mg via INTRAVENOUS
  Administered 2012-11-13: 1.4 mg via INTRAVENOUS
  Administered 2012-11-13: 2.4 mg via INTRAVENOUS
  Administered 2012-11-14: 1.2 mg via INTRAVENOUS
  Filled 2012-11-13: qty 25

## 2012-11-13 MED ORDER — HYDROMORPHONE HCL PF 1 MG/ML IJ SOLN
INTRAMUSCULAR | Status: AC
  Filled 2012-11-13: qty 1

## 2012-11-13 MED ORDER — DIPHENHYDRAMINE HCL 50 MG/ML IJ SOLN
50.0000 mg | Freq: Four times a day (QID) | INTRAMUSCULAR | Status: DC | PRN
  Administered 2012-11-13 – 2012-11-15 (×6): 50 mg via INTRAVENOUS
  Filled 2012-11-13 (×5): qty 1

## 2012-11-13 MED ORDER — ONDANSETRON HCL 4 MG/2ML IJ SOLN
INTRAMUSCULAR | Status: DC | PRN
  Administered 2012-11-13: 4 mg via INTRAVENOUS

## 2012-11-13 MED ORDER — BUPIVACAINE LIPOSOME 1.3 % IJ SUSP
INTRAMUSCULAR | Status: DC | PRN
  Administered 2012-11-13: 20 mL

## 2012-11-13 MED ORDER — CEFAZOLIN SODIUM-DEXTROSE 2-3 GM-% IV SOLR
2.0000 g | Freq: Three times a day (TID) | INTRAVENOUS | Status: AC
  Administered 2012-11-13 (×2): 2 g via INTRAVENOUS
  Filled 2012-11-13 (×3): qty 50

## 2012-11-13 MED ORDER — ONDANSETRON HCL 4 MG/2ML IJ SOLN: 4.0000 mg | Freq: Four times a day (QID) | INTRAMUSCULAR | Status: DC | PRN

## 2012-11-13 MED ORDER — HYDROMORPHONE HCL PF 1 MG/ML IJ SOLN
0.2500 mg | INTRAMUSCULAR | Status: DC | PRN
  Administered 2012-11-13 (×4): 0.5 mg via INTRAVENOUS

## 2012-11-13 MED ORDER — LIDOCAINE HCL (CARDIAC) 20 MG/ML IV SOLN
INTRAVENOUS | Status: DC | PRN
  Administered 2012-11-13: 40 mg via INTRAVENOUS

## 2012-11-13 MED ORDER — GLYCOPYRROLATE 0.2 MG/ML IJ SOLN
INTRAMUSCULAR | Status: DC | PRN
  Administered 2012-11-13: 0.6 mg via INTRAVENOUS

## 2012-11-13 MED ORDER — MENTHOL 3 MG MT LOZG: 1.0000 | LOZENGE | OROMUCOSAL | Status: DC | PRN

## 2012-11-13 MED ORDER — TRAMADOL HCL 50 MG PO TABS: 50.0000 mg | ORAL_TABLET | Freq: Four times a day (QID) | ORAL | Status: DC | PRN

## 2012-11-13 MED ORDER — ARTIFICIAL TEARS OP OINT
TOPICAL_OINTMENT | OPHTHALMIC | Status: DC | PRN
  Administered 2012-11-13: 1 via OPHTHALMIC

## 2012-11-13 MED ORDER — DIPHENHYDRAMINE HCL 50 MG/ML IJ SOLN
INTRAMUSCULAR | Status: AC
  Filled 2012-11-13: qty 1

## 2012-11-13 MED ORDER — DIPHENHYDRAMINE HCL 50 MG/ML IJ SOLN
INTRAMUSCULAR | Status: DC | PRN
  Administered 2012-11-13 (×2): 25 mg via INTRAVENOUS

## 2012-11-13 MED ORDER — ROCURONIUM BROMIDE 100 MG/10ML IV SOLN
INTRAVENOUS | Status: DC | PRN
  Administered 2012-11-13: 10 mg via INTRAVENOUS
  Administered 2012-11-13: 50 mg via INTRAVENOUS
  Administered 2012-11-13: 10 mg via INTRAVENOUS

## 2012-11-13 MED ORDER — HEMOSTATIC AGENTS (NO CHARGE) OPTIME
TOPICAL | Status: DC | PRN
  Administered 2012-11-13: 1 via TOPICAL

## 2012-11-13 MED ORDER — HYDROMORPHONE 0.3 MG/ML IV SOLN
INTRAVENOUS | Status: AC
  Administered 2012-11-13: 15:00:00
  Filled 2012-11-13: qty 25

## 2012-11-13 MED ORDER — ACETAMINOPHEN 325 MG PO TABS: 650.0000 mg | ORAL_TABLET | ORAL | Status: DC | PRN

## 2012-11-13 MED ORDER — ONDANSETRON HCL 4 MG/2ML IJ SOLN: 4.0000 mg | Freq: Once | INTRAMUSCULAR | Status: DC | PRN

## 2012-11-13 MED ORDER — PHENOL 1.4 % MT LIQD: 1.0000 | OROMUCOSAL | Status: DC | PRN

## 2012-11-13 MED ORDER — HYDROCODONE-ACETAMINOPHEN 5-325 MG PO TABS
1.0000 | ORAL_TABLET | ORAL | Status: DC | PRN
  Administered 2012-11-15 – 2012-11-16 (×5): 2 via ORAL
  Filled 2012-11-13 (×5): qty 2

## 2012-11-13 MED ORDER — DOCUSATE SODIUM 100 MG PO CAPS
100.0000 mg | ORAL_CAPSULE | Freq: Two times a day (BID) | ORAL | Status: DC
  Administered 2012-11-13 – 2012-11-15 (×5): 100 mg via ORAL
  Filled 2012-11-13 (×5): qty 1

## 2012-11-13 MED ORDER — FENTANYL CITRATE 0.05 MG/ML IJ SOLN
INTRAMUSCULAR | Status: DC | PRN
  Administered 2012-11-13: 100 ug via INTRAVENOUS
  Administered 2012-11-13 (×2): 50 ug via INTRAVENOUS
  Administered 2012-11-13: 100 ug via INTRAVENOUS
  Administered 2012-11-13: 50 ug via INTRAVENOUS
  Administered 2012-11-13: 100 ug via INTRAVENOUS
  Administered 2012-11-13: 50 ug via INTRAVENOUS

## 2012-11-13 MED ORDER — ALBUTEROL SULFATE HFA 108 (90 BASE) MCG/ACT IN AERS
2.0000 | INHALATION_SPRAY | Freq: Four times a day (QID) | RESPIRATORY_TRACT | Status: DC | PRN
  Filled 2012-11-13: qty 6.7

## 2012-11-13 MED ORDER — LORATADINE 10 MG PO TABS
10.0000 mg | ORAL_TABLET | Freq: Every day | ORAL | Status: DC
  Administered 2012-11-13 – 2012-11-15 (×3): 10 mg via ORAL
  Filled 2012-11-13 (×4): qty 1

## 2012-11-13 MED ORDER — DIPHENHYDRAMINE HCL 12.5 MG/5ML PO ELIX: 12.5000 mg | ORAL_SOLUTION | Freq: Four times a day (QID) | ORAL | Status: DC | PRN

## 2012-11-13 MED ORDER — NEOSTIGMINE METHYLSULFATE 1 MG/ML IJ SOLN
INTRAMUSCULAR | Status: DC | PRN
  Administered 2012-11-13: 4 mg via INTRAVENOUS

## 2012-11-13 MED ORDER — ACETAMINOPHEN 650 MG RE SUPP: 650.0000 mg | RECTAL | Status: DC | PRN

## 2012-11-13 MED ORDER — LACTATED RINGERS IV SOLN
INTRAVENOUS | Status: DC | PRN
  Administered 2012-11-13 (×2): via INTRAVENOUS

## 2012-11-13 MED ORDER — THROMBIN 20000 UNITS EX SOLR
CUTANEOUS | Status: DC | PRN
  Administered 2012-11-13: 11:00:00 via TOPICAL

## 2012-11-13 MED ORDER — DIPHENHYDRAMINE HCL 25 MG PO TABS
25.0000 mg | ORAL_TABLET | Freq: Four times a day (QID) | ORAL | Status: DC | PRN
  Filled 2012-11-13: qty 1

## 2012-11-13 MED ORDER — SODIUM CHLORIDE 0.9 % IV SOLN
INTRAVENOUS | Status: AC
  Filled 2012-11-13: qty 500

## 2012-11-13 MED ORDER — DIAZEPAM 5 MG PO TABS
ORAL_TABLET | ORAL | Status: AC
  Filled 2012-11-13: qty 1

## 2012-11-13 MED ORDER — ACETAMINOPHEN 10 MG/ML IV SOLN
1000.0000 mg | Freq: Once | INTRAVENOUS | Status: AC | PRN
  Administered 2012-11-13: 1000 mg via INTRAVENOUS

## 2012-11-13 MED ORDER — BUPIVACAINE LIPOSOME 1.3 % IJ SUSP
20.0000 mL | INTRAMUSCULAR | Status: DC
  Filled 2012-11-13: qty 20

## 2012-11-13 MED ORDER — DIPHENHYDRAMINE HCL 50 MG/ML IJ SOLN
25.0000 mg | Freq: Once | INTRAMUSCULAR | Status: AC
  Administered 2012-11-13: 25 mg via INTRAVENOUS

## 2012-11-13 MED ORDER — DIPHENHYDRAMINE HCL 50 MG/ML IJ SOLN
12.5000 mg | Freq: Four times a day (QID) | INTRAMUSCULAR | Status: DC | PRN
  Administered 2012-11-13: 12.5 mg via INTRAVENOUS
  Filled 2012-11-13 (×2): qty 1

## 2012-11-13 MED ORDER — PROPOFOL 10 MG/ML IV BOLUS
INTRAVENOUS | Status: DC | PRN
  Administered 2012-11-13: 170 mg via INTRAVENOUS

## 2012-11-13 MED ORDER — MIDAZOLAM HCL 5 MG/5ML IJ SOLN
INTRAMUSCULAR | Status: DC | PRN
  Administered 2012-11-13: 2 mg via INTRAVENOUS

## 2012-11-13 MED ORDER — ALUM & MAG HYDROXIDE-SIMETH 200-200-20 MG/5ML PO SUSP: 30.0000 mL | Freq: Four times a day (QID) | ORAL | Status: DC | PRN

## 2012-11-13 MED ORDER — ACETAMINOPHEN 10 MG/ML IV SOLN
INTRAVENOUS | Status: AC
  Filled 2012-11-13: qty 100

## 2012-11-13 MED ORDER — NALOXONE HCL 0.4 MG/ML IJ SOLN: 0.4000 mg | INTRAMUSCULAR | Status: DC | PRN

## 2012-11-13 MED ORDER — SODIUM CHLORIDE 0.9 % IR SOLN
Status: DC | PRN
  Administered 2012-11-13: 11:00:00

## 2012-11-13 MED ORDER — DIAZEPAM 5 MG PO TABS
5.0000 mg | ORAL_TABLET | Freq: Four times a day (QID) | ORAL | Status: DC | PRN
  Administered 2012-11-13 – 2012-11-16 (×9): 5 mg via ORAL
  Filled 2012-11-13 (×8): qty 1

## 2012-11-13 MED ORDER — BUPIVACAINE-EPINEPHRINE PF 0.5-1:200000 % IJ SOLN
INTRAMUSCULAR | Status: DC | PRN
  Administered 2012-11-13: 10 mL

## 2012-11-13 MED ORDER — MONTELUKAST SODIUM 10 MG PO TABS
10.0000 mg | ORAL_TABLET | Freq: Every day | ORAL | Status: DC
  Administered 2012-11-13 – 2012-11-15 (×3): 10 mg via ORAL
  Filled 2012-11-13 (×4): qty 1

## 2012-11-13 MED ORDER — 0.9 % SODIUM CHLORIDE (POUR BTL) OPTIME
TOPICAL | Status: DC | PRN
  Administered 2012-11-13: 1000 mL

## 2012-11-13 MED ORDER — FAMOTIDINE 20 MG PO TABS
20.0000 mg | ORAL_TABLET | Freq: Two times a day (BID) | ORAL | Status: DC
  Administered 2012-11-14 – 2012-11-15 (×4): 20 mg via ORAL
  Filled 2012-11-13 (×8): qty 1

## 2012-11-13 MED ORDER — LACTATED RINGERS IV SOLN
INTRAVENOUS | Status: DC
  Administered 2012-11-13 – 2012-11-15 (×4): via INTRAVENOUS

## 2012-11-13 SURGICAL SUPPLY — 66 items
BAG DECANTER FOR FLEXI CONT (MISCELLANEOUS) ×2 IMPLANT
BENZOIN TINCTURE PRP APPL 2/3 (GAUZE/BANDAGES/DRESSINGS) ×2 IMPLANT
BLADE SURG ROTATE 9660 (MISCELLANEOUS) IMPLANT
BRUSH SCRUB EZ PLAIN DRY (MISCELLANEOUS) ×2 IMPLANT
BUR ACORN 6.0 (BURR) ×2 IMPLANT
BUR MATCHSTICK NEURO 3.0 LAGG (BURR) ×2 IMPLANT
CANISTER SUCTION 2500CC (MISCELLANEOUS) ×2 IMPLANT
CAP REVERE LOCKING (Cap) ×8 IMPLANT
CLOTH BEACON ORANGE TIMEOUT ST (SAFETY) ×2 IMPLANT
CONT SPEC 4OZ CLIKSEAL STRL BL (MISCELLANEOUS) ×4 IMPLANT
COVER BACK TABLE 24X17X13 BIG (DRAPES) IMPLANT
COVER TABLE BACK 60X90 (DRAPES) ×2 IMPLANT
DRAPE C-ARM 42X72 X-RAY (DRAPES) ×4 IMPLANT
DRAPE LAPAROTOMY 100X72X124 (DRAPES) ×2 IMPLANT
DRAPE POUCH INSTRU U-SHP 10X18 (DRAPES) ×2 IMPLANT
DRAPE PROXIMA HALF (DRAPES) ×2 IMPLANT
DRAPE SURG 17X23 STRL (DRAPES) ×8 IMPLANT
DRESSING TELFA 8X3 (GAUZE/BANDAGES/DRESSINGS) ×2 IMPLANT
ELECT BLADE 4.0 EZ CLEAN MEGAD (MISCELLANEOUS) ×2
ELECT REM PT RETURN 9FT ADLT (ELECTROSURGICAL) ×2
ELECTRODE BLDE 4.0 EZ CLN MEGD (MISCELLANEOUS) ×1 IMPLANT
ELECTRODE REM PT RTRN 9FT ADLT (ELECTROSURGICAL) ×1 IMPLANT
GAUZE SPONGE 4X4 16PLY XRAY LF (GAUZE/BANDAGES/DRESSINGS) ×2 IMPLANT
GLOVE BIO SURGEON STRL SZ8.5 (GLOVE) ×4 IMPLANT
GLOVE BIOGEL PI IND STRL 8.5 (GLOVE) ×1 IMPLANT
GLOVE BIOGEL PI INDICATOR 8.5 (GLOVE) ×1
GLOVE EXAM NITRILE LRG STRL (GLOVE) ×4 IMPLANT
GLOVE EXAM NITRILE MD LF STRL (GLOVE) IMPLANT
GLOVE EXAM NITRILE XL STR (GLOVE) IMPLANT
GLOVE EXAM NITRILE XS STR PU (GLOVE) IMPLANT
GLOVE INDICATOR 7.5 STRL GRN (GLOVE) ×4 IMPLANT
GLOVE SS BIOGEL STRL SZ 8 (GLOVE) ×2 IMPLANT
GLOVE SUPERSENSE BIOGEL SZ 8 (GLOVE) ×2
GLOVE SURG SS PI 7.5 STRL IVOR (GLOVE) ×2 IMPLANT
GLOVE SURG SS PI 8.0 STRL IVOR (GLOVE) ×4 IMPLANT
GOWN BRE IMP SLV AUR LG STRL (GOWN DISPOSABLE) ×2 IMPLANT
GOWN BRE IMP SLV AUR XL STRL (GOWN DISPOSABLE) ×4 IMPLANT
GOWN STRL REIN 2XL LVL4 (GOWN DISPOSABLE) ×2 IMPLANT
KIT BASIN OR (CUSTOM PROCEDURE TRAY) ×2 IMPLANT
KIT ROOM TURNOVER OR (KITS) ×2 IMPLANT
NEEDLE HYPO 21X1.5 SAFETY (NEEDLE) IMPLANT
NEEDLE HYPO 22GX1.5 SAFETY (NEEDLE) ×2 IMPLANT
NS IRRIG 1000ML POUR BTL (IV SOLUTION) ×2 IMPLANT
PACK FOAM VITOSS 10CC (Orthopedic Implant) ×2 IMPLANT
PACK LAMINECTOMY NEURO (CUSTOM PROCEDURE TRAY) ×2 IMPLANT
PAD ARMBOARD 7.5X6 YLW CONV (MISCELLANEOUS) ×6 IMPLANT
PATTIES SURGICAL .5 X1 (DISPOSABLE) IMPLANT
PUTTY 10ML ACTIFUSE ABX (Putty) ×2 IMPLANT
ROD REVERE 6.35 40MM (Rod) ×4 IMPLANT
SCREW REVERE 6.35 6.5MMX45 (Screw) ×4 IMPLANT
SCREW REVERE 6.5X50MM (Screw) ×4 IMPLANT
SPACER SUSTAIN O 10X26 12MM (Spacer) ×4 IMPLANT
SPONGE GAUZE 4X4 12PLY (GAUZE/BANDAGES/DRESSINGS) ×2 IMPLANT
SPONGE LAP 4X18 X RAY DECT (DISPOSABLE) IMPLANT
SPONGE NEURO XRAY DETECT 1X3 (DISPOSABLE) IMPLANT
SPONGE SURGIFOAM ABS GEL 100 (HEMOSTASIS) ×2 IMPLANT
STRIP CLOSURE SKIN 1/2X4 (GAUZE/BANDAGES/DRESSINGS) ×2 IMPLANT
SUT VIC AB 1 CT1 18XBRD ANBCTR (SUTURE) ×2 IMPLANT
SUT VIC AB 1 CT1 8-18 (SUTURE) ×2
SUT VIC AB 2-0 CP2 18 (SUTURE) ×4 IMPLANT
SYR 20CC LL (SYRINGE) ×2 IMPLANT
SYR 20ML ECCENTRIC (SYRINGE) ×2 IMPLANT
TOWEL OR 17X24 6PK STRL BLUE (TOWEL DISPOSABLE) ×2 IMPLANT
TOWEL OR 17X26 10 PK STRL BLUE (TOWEL DISPOSABLE) ×2 IMPLANT
TRAY FOLEY CATH 14FRSI W/METER (CATHETERS) ×2 IMPLANT
WATER STERILE IRR 1000ML POUR (IV SOLUTION) ×2 IMPLANT

## 2012-11-13 NOTE — Preoperative (Signed)
Beta Blockers   Reason not to administer Beta Blockers:Not Applicable 

## 2012-11-13 NOTE — Transfer of Care (Signed)
Immediate Anesthesia Transfer of Care Note  Patient: Susan Sullivan  Procedure(s) Performed: Procedure(s) with comments: POSTERIOR LUMBAR FUSION 1 LEVEL.( MD Aware No C-ARM) (N/A) - Lumbar five sacral one laminectomy with posterior lumbar interbody fusion interbody prothesis posterolateral arthrodesis and posterior segmental instrumentation  Patient Location: PACU  Anesthesia Type:General  Level of Consciousness: patient cooperative and responds to stimulation  Airway & Oxygen Therapy: Patient Spontanous Breathing and Patient connected to nasal cannula oxygen  Post-op Assessment: Report given to PACU RN and Post -op Vital signs reviewed and stable  Post vital signs: Reviewed and stable  Complications: No apparent anesthesia complications

## 2012-11-13 NOTE — Op Note (Signed)
Brief history: The patient is a 37 year old white female who has complained of chronic back and leg pain. She has failed medical management and was worked up with a lumbar MRI and discogram. This demonstrated she had a transitional vertebrae. At the first mobile segment i.e. L4-5 the patient has concordant pain. I discussed the various treatment options with the patient including surgery. She has weighed the risks, benefits, and alternatives surgery and decided proceed with an L4-5 posterior decompression, instrumentation, and fusion.  Preoperative diagnosis: L4-5 Degenerative disc disease, spinal stenosis compressing both the L4 and the L5 nerve roots; lumbago; lumbar radiculopathy  Postoperative diagnosis: The same  Procedure: Bilateral L4 Laminotomy/foraminotomies to decompress the bilateral L4 and L5 nerve roots(the work required to do this was in addition to the work required to do the posterior lumbar interbody fusion because of the patient's spinal stenosis, facet arthropathy. Etc. requiring a wide decompression of the nerve roots.); L4-5 posterior lumbar interbody fusion with local morselized autograft bone and Actifusebone graft extender; insertion of interbody prosthesis at L4-5 (globus peek interbody prosthesis); posterior nonsegmental instrumentation from L4 to L5 with globus titanium pedicle screws and rods; posterior lateral arthrodesis at L4-5 with local morselized autograft bone and Vitoss bone graft extender.  Surgeon: Dr. Delma Officer  Asst.: None  Anesthesia: Gen. endotracheal  Estimated blood loss: 200 cc  Drains: One medium Hemovac  Complications: None  Description of procedure: The patient was brought to the operating room by the anesthesia team. General endotracheal anesthesia was induced. The patient was turned to the prone position on the Wilson frame. The patient's lumbosacral region was then prepared with Betadine scrub and Betadine solution. Sterile drapes were  applied.  I then injected the area to be incised with Marcaine with epinephrine solution. I then used the scalpel to make a linear midline incision over the L4-5 interspace. I then used electrocautery to perform a bilateral subperiosteal dissection exposing the spinous process and lamina of L4-5. We then obtained intraoperative radiograph to confirm our location. We then inserted the Verstrac retractor to provide exposure.  I began the decompression by using the high speed drill to perform laminotomies at L4 bilaterally. We then used the Kerrison punches to widen the laminotomy and removed the ligamentum flavum at L4-5 bilaterally. We used the Kerrison punches to remove the medial facets at L4-5 bilaterally. We performed wide foraminotomies about the bilateral L4 and L5 nerve roots completing the decompression.  We now turned our attention to the posterior lumbar interbody fusion. I used a scalpel to incise the intervertebral disc at L4-5. I then performed a partial intervertebral discectomy at L4-5 using the pituitary forceps. We prepared the vertebral endplates at L4-5 for the fusion by removing the soft tissues with the curettes. We then used the trial spacers to pick the appropriate sized interbody prosthesis. We prefilled his prosthesis with a combination of local morselized autograft bone that we obtained during the decompression as well as Actifuse bone graft extender. We inserted the prefilled prosthesis into the interspace at L4-5. There was a good snug fit of the prosthesis in the interspace. We then filled and the remainder of the intervertebral disc space with local morselized autograft bone and Actifuse. This completed the posterior lumbar interbody arthrodesis.  We now turned attention to the instrumentation. Under fluoroscopic guidance we cannulated the bilateral L4 and L5 pedicles with the bone probe. We then removed the bone probe. We then tapped the pedicle with a 5.5 millimeter tap. We  then removed the  tap. We probed inside the tapped pedicle with a ball probe to rule out cortical breaches. We then inserted a 6.5 x 45 and 50 millimeter pedicle screw into the L4 and L5 pedicles bilaterally under fluoroscopic guidance. We then palpated along the medial aspect of the pedicles to rule out cortical breaches. There were none. The nerve roots were not injured. We then connected the unilateral pedicle screws with a lordotic rod. We compressed the construct and secured the rod in place with the caps. We then tightened the caps appropriately. This completed the instrumentation from L4-5.  We now turned our attention to the posterior lateral arthrodesis at L4-5. We used the high-speed drill to decorticate the remainder of the facets, pars, transverse process at L4-5. We then applied a combination of local morselized autograft bone and Vitoss bone graft extender over these decorticated posterior lateral structures. This completed the posterior lateral arthrodesis.  We then obtained hemostasis using bipolar electrocautery. We irrigated the wound out with bacitracin solution. We inspected the thecal sac and nerve roots and noted they were well decompressed. We then removed the retractor. We placed a medium Hemovac drain in the epidural space and tunneled out through separate stab wound. We reapproximated patient's thoracolumbar fascia with interrupted #1 Vicryl suture. We reapproximated patient's subcutaneous tissue with interrupted 2-0 Vicryl suture. The reapproximated patient's skin with Steri-Strips and benzoin. The wound was then coated with bacitracin ointment. A sterile dressing was applied. The drapes were removed. The patient was subsequently returned to the supine position where they were extubated by the anesthesia team. He was then transported to the post anesthesia care unit in stable condition. All sponge instrument and needle counts were reportedly correct at the end of this  case.

## 2012-11-13 NOTE — Progress Notes (Signed)
Received patient from PACU.  Patient alert and oriented x4.  Vitals stable.  Patient oriented to room and unit.  Dressing and drain intact to back.  Will continue to monitor.

## 2012-11-13 NOTE — H&P (Signed)
Subjective: The patient is a 37 year old white female who has complained of chronic back and leg pain. She has failed medical management. She was worked up with a lumbar MRI and a lumbar discogram which demonstrated concordant pain at L4-5. I discussed the various treatment options with the patient including surgery. She has weighed the risks, benefits, and alternatives surgery and decided proceed with an L4-5 decompression, instrumentation, and fusion.   Past Medical History  Diagnosis Date  . Ectopic pregnancy, tubal Sept 2012    left,  with rupture ,  and concurrent right ovarian cyst rupture  . Degenerative disk disease     not on medication due to attempts to get pregant  . 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 MTHFR (methylene THF reductase) deficiency and homocystinuria   . Complication of anesthesia     ketamin - difficulty waking up  . Dysrhythmia     tachycardia - sees dr. Kirke Corin  . GERD (gastroesophageal reflux disease)     Past Surgical History  Procedure Laterality Date  . Cesarean section    . Dilation and curettage of uterus  2012  . Spine surgery  2007    SI joint fusion [employer]  . Cholecystectomy  Dec 2012    Festus Barren, [facility]    Allergies  Allergen Reactions  . Clindamycin/Lincomycin Anaphylaxis  . Latex Anaphylaxis  . Peanut-Containing Drug Products Anaphylaxis  . Celebrex (Celecoxib) Other (See Comments)    Insomnia   . Cymbalta (Duloxetine Hcl) Other (See Comments)    Hyperactivity.   . Eggs Or Egg-Derived Products     Also raw fruits/veggies  . Gabapentin Other (See Comments)    Hyperacitivity.   . Meat Extract Nausea And Vomiting    Red meat and pork.   . Nubain (Nalbuphine Hcl) Itching  . Other     Narcotics-rash, itching  . Propofol Diarrhea    Never had issue with prior surgeries receiving this  . Soy Allergy   . Sudafed (Pseudoephedrine Hcl) Other (See Comments)    Tachycardia     History  Substance Use  Topics  . Smoking status: Never Smoker   . Smokeless tobacco: Never Used  . Alcohol Use: No    Family History  Problem Relation Age of Onset  . Arthritis Mother   . Hyperlipidemia Mother   . Heart disease Mother   . Diabetes Mother   . Arthritis Father   . Hyperlipidemia Father   . Heart disease Father 50    CABG   . Diabetes Father   . Stroke Paternal Grandmother   . Heart disease Paternal Grandmother   . Heart disease Paternal Grandfather    Prior to Admission medications   Medication Sig Start Date End Date Taking? Authorizing Provider  acyclovir (ZOVIRAX) 400 MG tablet Take 400 mg by mouth daily as needed. Cold sores   Yes Historical Provider, MD  albuterol (PROAIR HFA) 108 (90 BASE) MCG/ACT inhaler Inhale 2 puffs into the lungs every 6 (six) hours as needed for wheezing. 06/12/12  Yes Sherlene Shams, MD  ALPRAZolam Prudy Feeler) 0.25 MG tablet Take 1 tablet (0.25 mg total) by mouth 2 (two) times daily as needed for sleep. 08/06/12  Yes Sherlene Shams, MD  Ascorbic Acid (VITAMIN C) 100 MG tablet Take 100 mg by mouth daily.   Yes Historical Provider, MD  aspirin 81 MG tablet Take 81 mg by mouth daily.   Yes Historical Provider, MD  cetirizine (ZYRTEC) 10 MG tablet Take 1 tablet (10 mg total) by mouth daily. 05/10/11 11/01/12 Yes Sherlene Shams, MD  cholecalciferol (VITAMIN D) 1000 UNITS tablet Take 1,000 Units by mouth daily.   Yes Historical Provider, MD  cyclobenzaprine (FLEXERIL) 10 MG tablet Take 1 tablet (10 mg total) by mouth 3 (three) times daily as needed for muscle spasms. 03/04/12  Yes Sherlene Shams, MD  diazepam (VALIUM) 5 MG tablet 1 or 2 tablets at bedtime as needed for spasm 08/21/12  Yes Sherlene Shams, MD  diphenhydrAMINE (BENADRYL) 25 MG tablet Take 25 mg by mouth every 6 (six) hours as needed for itching or allergies.   Yes Historical Provider, MD  L-Methylfolate 15 MG TABS Take 1 tablet (15 mg total) by mouth daily. 08/06/12  Yes Sherlene Shams, MD  metoprolol tartrate  (LOPRESSOR) 25 MG tablet Take 12.5 mg by mouth 2 (two) times daily as needed. For tachycardia   Yes Historical Provider, MD  montelukast (SINGULAIR) 10 MG tablet Take 1 tablet (10 mg total) by mouth at bedtime. 08/06/12 08/06/13 Yes Sherlene Shams, MD  ondansetron (ZOFRAN ODT) 4 MG disintegrating tablet Take 1 tablet (4 mg total) by mouth every 8 (eight) hours as needed for nausea. 05/01/12  Yes Beverley Fiedler, MD  ranitidine (ZANTAC) 300 MG tablet Take 1 tablet (300 mg total) by mouth at bedtime. 08/06/12 08/06/13 Yes Sherlene Shams, MD  traMADol (ULTRAM) 50 MG tablet Take 50 mg by mouth every 6 (six) hours as needed for pain.   Yes Historical Provider, MD     Review of Systems  Positive ROS: As above  All other systems have been reviewed and were otherwise negative with the exception of those mentioned in the HPI and as above.  Objective: Vital signs in last 24 hours: Temp:  [97.7 F (36.5 C)] 97.7 F (36.5 C) (06/25 0836) Pulse Rate:  [80] 80 (06/25 0836) Resp:  [18] 18 (06/25 0836) BP: (112)/(74) 112/74 mmHg (06/25 0836) SpO2:  [99 %] 99 % (06/25 0836)  General Appearance: Alert, cooperative, no distress, appears stated age Head: Normocephalic, without obvious abnormality, atraumatic Eyes: PERRL, conjunctiva/corneas clear, EOM's intact, fundi benign, both eyes      Ears: Normal TM's and external ear canals, both ears Throat: Lips, mucosa, and tongue normal; teeth and gums normal Neck: Supple, symmetrical, trachea midline, no adenopathy; thyroid: No enlargement/tenderness/nodules; no carotid bruit or JVD Back: Symmetric, no curvature, ROM normal, no CVA tenderness Lungs: Clear to auscultation bilaterally, respirations unlabored Heart: Regular rate and rhythm, S1 and S2 normal, no murmur, rub or gallop Abdomen: Soft, non-tender, bowel sounds active all four quadrants, no masses, no organomegaly Extremities: Extremities normal, atraumatic, no cyanosis or edema Pulses: 2+ and symmetric  all extremities Skin: Skin color, texture, turgor normal, no rashes or lesions  NEUROLOGIC:   Mental status: alert and oriented, no aphasia, good attention span, Fund of knowledge/ memory ok Motor Exam - grossly normal Sensory Exam - grossly normal Reflexes:  Coordination - grossly normal Gait - grossly normal Balance - grossly normal Cranial Nerves: I: smell Not tested  II: visual acuity  OS: Normal    OD: Normal   II: visual fields Full to confrontation  II: pupils Equal, round, reactive to light  III,VII: ptosis None  III,IV,VI: extraocular muscles  Full ROM  V: mastication Normal  V: facial light touch sensation  Normal  V,VII: corneal reflex  Present  VII: facial muscle function - upper  Normal  VII: facial muscle function - lower Normal  VIII: hearing Not tested  IX: soft palate elevation  Normal  IX,X: gag reflex Present  XI: trapezius strength  5/5  XI: sternocleidomastoid strength 5/5  XI: neck flexion strength  5/5  XII: tongue strength  Normal    Data Review Lab Results  Component Value Date   WBC 6.8 11/07/2012   HGB 12.4 11/07/2012   HCT 37.2 11/07/2012   MCV 90.3 11/07/2012   PLT 242 11/07/2012   Lab Results  Component Value Date   NA 138 11/07/2012   K 3.4* 11/07/2012   CL 103 11/07/2012   CO2 27 11/07/2012   BUN 8 11/07/2012   CREATININE 0.68 11/07/2012   GLUCOSE 114* 11/07/2012   No results found for this basename: INR, PROTIME    Assessment/Plan: L4-5 disc degeneration, lumbago, lumbar radiculopathy: I discussed situation with the patient. I have reviewed her MRI scan and discogram with her. I've pointed out the abnormalities. We have discussed the various treatment options including surgery. I have described the surgical treatment option of an L4-5 posterior lumbar interbody fusion, posterior instrumentation, etc. I have described the surgery to her. I have shown her surgical models. We have discussed the risks, benefits, alternatives, and likelihood of  achieving our goals with surgery. I have answered all the patient's questions. She has decided proceed with surgery.   Rithika Seel D 11/13/2012 10:33 AM

## 2012-11-13 NOTE — Anesthesia Preprocedure Evaluation (Signed)
Anesthesia Evaluation  Patient identified by MRN, date of birth, ID band Patient awake    Reviewed: Allergy & Precautions, H&P , NPO status , Patient's Chart, lab work & pertinent test results  Airway Mallampati: II      Dental  (+) Teeth Intact and Dental Advisory Given   Pulmonary  breath sounds clear to auscultation        Cardiovascular Rhythm:Regular Rate:Normal     Neuro/Psych    GI/Hepatic   Endo/Other    Renal/GU      Musculoskeletal   Abdominal   Peds  Hematology   Anesthesia Other Findings   Reproductive/Obstetrics                           Anesthesia Physical Anesthesia Plan  ASA: II  Anesthesia Plan: General   Post-op Pain Management:    Induction: Intravenous  Airway Management Planned: Oral ETT  Additional Equipment:   Intra-op Plan:   Post-operative Plan:   Informed Consent: I have reviewed the patients History and Physical, chart, labs and discussed the procedure including the risks, benefits and alternatives for the proposed anesthesia with the patient or authorized representative who has indicated his/her understanding and acceptance.   Dental advisory given  Plan Discussed with: CRNA, Anesthesiologist and Surgeon  Anesthesia Plan Comments: (HNP L4-5 H/O tachycardia  Anxiety Multiple allergies has tolerated propofol without difficulty  Plan GA with oral ETT  Kipp Brood, MD)        Anesthesia Quick Evaluation

## 2012-11-13 NOTE — Anesthesia Postprocedure Evaluation (Signed)
  Anesthesia Post-op Note  Patient: Product/process development scientist  Procedure(s) Performed: Procedure(s) with comments: POSTERIOR LUMBAR FUSION 1 LEVEL.( MD Aware No C-ARM) (N/A) - Lumbar five sacral one laminectomy with posterior lumbar interbody fusion interbody prothesis posterolateral arthrodesis and posterior segmental instrumentation  Patient Location: PACU  Anesthesia Type:General  Level of Consciousness: awake, alert  and oriented  Airway and Oxygen Therapy: Patient Spontanous Breathing and Patient connected to nasal cannula oxygen  Post-op Pain: mild  Post-op Assessment: Post-op Vital signs reviewed, Patient's Cardiovascular Status Stable, Respiratory Function Stable, Patent Airway and Pain level controlled  Post-op Vital Signs: stable  Complications: No apparent anesthesia complications

## 2012-11-14 LAB — CBC
MCH: 30.3 pg (ref 26.0–34.0)
MCV: 91.1 fL (ref 78.0–100.0)
Platelets: 212 10*3/uL (ref 150–400)
RDW: 13.5 % (ref 11.5–15.5)

## 2012-11-14 LAB — BASIC METABOLIC PANEL
CO2: 30 mEq/L (ref 19–32)
Calcium: 8.7 mg/dL (ref 8.4–10.5)
Creatinine, Ser: 0.69 mg/dL (ref 0.50–1.10)

## 2012-11-14 MED ORDER — NALOXONE HCL 0.4 MG/ML IJ SOLN
INTRAMUSCULAR | Status: AC
  Administered 2012-11-14: 0.4 mg via INTRAVENOUS
  Filled 2012-11-14: qty 1

## 2012-11-14 MED ORDER — HYDROMORPHONE HCL PF 1 MG/ML IJ SOLN
2.0000 mg | INTRAMUSCULAR | Status: DC | PRN
  Administered 2012-11-14: 2 mg via INTRAVENOUS
  Filled 2012-11-14 (×2): qty 2

## 2012-11-14 MED ORDER — NALOXONE HCL 0.4 MG/ML IJ SOLN: 0.4000 mg | INTRAMUSCULAR | Status: DC | PRN

## 2012-11-14 MED ORDER — DIPHENHYDRAMINE HCL 50 MG/ML IJ SOLN: 12.5000 mg | Freq: Four times a day (QID) | INTRAMUSCULAR | Status: DC | PRN

## 2012-11-14 MED ORDER — SODIUM CHLORIDE 0.9 % IJ SOLN: 9.0000 mL | INTRAMUSCULAR | Status: DC | PRN

## 2012-11-14 MED ORDER — FENTANYL 10 MCG/ML IV SOLN
INTRAVENOUS | Status: DC
  Administered 2012-11-14: 144 ug via INTRAVENOUS
  Administered 2012-11-14: 330 ug via INTRAVENOUS
  Administered 2012-11-14: 10 ug via INTRAVENOUS
  Administered 2012-11-15: 137 ug via INTRAVENOUS
  Administered 2012-11-15: 05:00:00 via INTRAVENOUS
  Administered 2012-11-15: 140 ug via INTRAVENOUS
  Filled 2012-11-14 (×3): qty 50

## 2012-11-14 MED ORDER — EPINEPHRINE HCL 1 MG/ML IJ SOLN
INTRAMUSCULAR | Status: AC
  Administered 2012-11-14: 1 mg
  Filled 2012-11-14: qty 1

## 2012-11-14 MED ORDER — ONDANSETRON HCL 4 MG/2ML IJ SOLN: 4.0000 mg | Freq: Four times a day (QID) | INTRAMUSCULAR | Status: DC | PRN

## 2012-11-14 MED ORDER — EPINEPHRINE HCL 1 MG/ML IJ SOLN: 1.0000 mg | Freq: Once | INTRAMUSCULAR | Status: AC

## 2012-11-14 MED ORDER — DIPHENHYDRAMINE HCL 12.5 MG/5ML PO ELIX: 12.5000 mg | ORAL_SOLUTION | Freq: Four times a day (QID) | ORAL | Status: DC | PRN

## 2012-11-14 MED FILL — Heparin Sodium (Porcine) Inj 1000 Unit/ML: INTRAMUSCULAR | Qty: 30 | Status: AC

## 2012-11-14 MED FILL — Sodium Chloride Irrigation Soln 0.9%: Qty: 3000 | Status: AC

## 2012-11-14 MED FILL — Sodium Chloride IV Soln 0.9%: INTRAVENOUS | Qty: 1000 | Status: AC

## 2012-11-14 NOTE — Plan of Care (Signed)
Problem: Consults Goal: Diagnosis - Spinal Surgery Thoraco/Lumbar Spine Fusion     

## 2012-11-14 NOTE — Progress Notes (Signed)
Dilaudid PCA order d/c'd per order and patient complained of severe pain to her back. As per new order, Dilaudid 2 mg IV given at 0704. Within 10 mins of administration, patient stated calling out for the nurse that she is having trouble breathing. Writer and other nurses went in the room to assist patient. Patient was yelling out to help her breath. O2 at 3 L/min applied via nasal canula. Patient stated she was having reaction and Narcan given per standing protocol. She was also calling out for Epinephrine which was given also. Dr. Lovell Sheehan office paged several times but writer was told later by their answering service that he was in surgery. Rapid response called and came to assist. Patient was put on non re breather mask as her O2 level drops to 70's due to her hyperventilating. It seems like she was having a panic attack. Patient encouraged to calm down and take deep breaths which she finally did. Oncoming shift to monitor at this time.

## 2012-11-14 NOTE — Evaluation (Signed)
Physical Therapy Evaluation Patient Details Name: Susan Sullivan MRN: [id number] DOB: 12/15/1975 Today's Date: 11/14/2012 Time: 1478-2956 PT Time Calculation (min): 39 min  PT Assessment / Plan / Recommendation History of Present Illness   37 y/o female s/p PLIF.   Clinical Impression  Presents to PT today with below impairments impacting functional mobility and independence. Will benefit physical therapy in the acute setting to maximize safety for d/c home with support from husband. Encouraged her to ambulate again today.     PT Assessment  Patient needs continued PT services    Follow Up Recommendations  Outpatient PT (when cleared by surgeon)    Does the patient have the potential to tolerate intense rehabilitation      Barriers to Discharge        Equipment Recommendations  None recommended by PT    Recommendations for Other Services     Frequency Min 5X/week    Precautions / Restrictions Precautions Precautions: Back Precaution Comments: reviewed log roll technique and 3/3 back precautions Restrictions Weight Bearing Restrictions: No   Pertinent Vitals/Pain Reporting 5/6 pain, PCA encouraged      Mobility  Bed Mobility Bed Mobility: Rolling Left;Left Sidelying to Sit;Sit to Sidelying Left Rolling Left: 4: Min guard Left Sidelying to Sit: 4: Min assist Sit to Sidelying Left: 4: Min assist Details for Bed Mobility Assistance: min facilitation for follow through, pt holding onto therapists hand for assist Transfers Transfers: Sit to Stand;Stand to Sit Sit to Stand: 4: Min assist;With upper extremity assist;From bed Stand to Sit: 4: Min assist;With upper extremity assist;To bed Details for Transfer Assistance: bilateral HHA from husband to assist with lift off and stabilize as she stood, cues for safe hand placement and assist from husband to lower slowly to bed Ambulation/Gait Ambulation/Gait Assistance: 4: Min assist Ambulation Distance (Feet): 30  Feet Ambulation/Gait Assistance Details: bilateral HHA from husband ambulating in front of her providing upper extremity support for tall posture Gait Pattern: Step-through pattern;Decreased stride length;Shuffle Gait velocity: decreased Stairs: No    Exercises     PT Diagnosis: Difficulty walking;Generalized weakness;Acute pain  PT Problem List: Decreased strength;Decreased activity tolerance;Decreased balance;Decreased mobility;Decreased knowledge of precautions;Decreased knowledge of use of DME PT Treatment Interventions: DME instruction;Gait training;Functional mobility training;Therapeutic activities;Neuromuscular re-education;Patient/family education     PT Goals(Current goals can be found in the care plan section) Acute Rehab PT Goals Patient Stated Goal: To decrease pain PT Goal Formulation: With patient Time For Goal Achievement: 11/21/12 Potential to Achieve Goals: Good  Visit Information  Last PT Received On: 11/14/12 Assistance Needed: +1 PT/OT Co-Evaluation/Treatment: Yes       Prior Functioning  Home Living Family/patient expects to be discharged to:: Private residence Living Arrangements: Spouse/significant other Available Help at Discharge: Family;Available 24 hours/day (initially) Type of Home: House Home Access: Stairs to enter [employer] of Steps: 4 Entrance Stairs-Rails: Right Home Layout: One level Home Equipment: Shower seat - built in Prior Function Level of Independence: Independent Comments: works as a Engineer, civil (consulting) at [employer]: No difficulties    Cognition  Cognition Arousal/Alertness: Awake/alert Behavior During Therapy: WFL for tasks assessed/performed Overall Cognitive Status: Within Functional Limits for tasks assessed    Extremity/Trunk Assessment Upper Extremity Assessment Upper Extremity Assessment: Defer to OT evaluation Lower Extremity Assessment Lower Extremity Assessment: RLE deficits/detail RLE  Deficits / Details: did not test because of pain but patient reports RLE weaker than left and prior foot drop however during ambulation pt appears to clear right foot rather well at  times (inconsistent) RLE: Unable to fully assess due to pain   Balance    End of Session PT - End of Session Equipment Utilized During Treatment: Gait belt;Back brace Activity Tolerance: Patient tolerated treatment well;Patient limited by pain Patient left: in bed;with call bell/phone within reach;with family/visitor present Nurse Communication: Mobility status  GP     [facility] HELEN 11/14/2012, 4:00 PM

## 2012-11-14 NOTE — Progress Notes (Signed)
Nutrition Brief Note  Patient identified on the Malnutrition Screening Tool (MST) Report. Pt reports weight loss 2/2 allergies, however per EPIC weight hx, weights have been stable:  Wt Readings from Last 15 Encounters:  11/13/12 208 lb 15.9 oz (94.8 kg)  11/13/12 208 lb 15.9 oz (94.8 kg)  11/07/12 209 lb 1.6 oz (94.847 kg)  08/27/12 206 lb 12 oz (93.781 kg)  08/06/12 208 lb 8 oz (94.575 kg)  05/02/12 206 lb (93.441 kg)  05/01/12 206 lb (93.441 kg)  04/23/12 207 lb 8 oz (94.121 kg)  04/08/12 210 lb 4 oz (95.369 kg)  02/23/12 209 lb 8 oz (95.029 kg)  01/04/12 210 lb (95.255 kg)  05/10/11 203 lb (92.08 kg)    Body mass index is 31.79 kg/(m^2). Patient meets criteria for Obesity Class I based on current BMI.   Current diet order is Regular. Labs and medications reviewed.   Pt reports that Tax adviser spoke with her yesterday about her food allergies. Per pt, she is allergic to: clams, oysters, peanuts and peanut-containing products, red meat and pork, eggs and egg-containing products, raw fruits and veggies (except tomatoes and iceberg lettuce), soy products (except Ranch dressing.) Reviewed food allergies with Nutrition Ambassador.  No nutrition interventions warranted at this time. If nutrition issues arise, please consult RD. If foodservice issues arise - please Community education officer.  Jarold Motto MS, RD, LDN Pager: [phone] After-hours pager: [phone]

## 2012-11-14 NOTE — Progress Notes (Signed)
Came to visit patient as a benefit of being a [employer] employee or dependent with [employer]. She was sleeping soundly. Did not want to disturb. Left [facility] to Wellness brochure and contact information at bedside. Will come back at later time. Raiford Noble, MSN- Ed, RN,BSN- [facility] [phone]

## 2012-11-14 NOTE — Progress Notes (Signed)
Called by Michele Mcalpine RN, charge nurse at request of patient because patient upset that PCA respiration alarms beeping when patient's respiration rate drops below 7.  Pt unable to sleep.  Explained to patient that the monitors are in place for her safety, and that she we could not remove the monitors and leave the PCA dilaudid per protocol.  Patient states that she has been silencing the alarms when it beeps.  Explained that we could not address the beeps if she silences the alarm.  Patient very upset, unwilling to try another pain medication regimen to remove the PCA monitors.  Patient asked for [place] to leave the machine alone and leave her room.  Advised charge nurse to have nurse stay close to room to address monitors.  Will return at patient request   Ezra Sites RN  Administrative Coordinator

## 2012-11-14 NOTE — Progress Notes (Signed)
PT Cancellation Note  Patient Details Name: Susan Sullivan MRN: [id number] DOB: [birth year]   Cancelled Treatment:    Reason Eval/Treat Not Completed: Pain limiting ability to participate. Patient in significant pain, lying flat and crying. RN aware and bringing pain meds. Offered to help her change positions but patient declines. Will f/u as pain more controlled.   [facility] HELEN 11/14/2012, 11:58 AM Pager: [phone]

## 2012-11-14 NOTE — Progress Notes (Signed)
Patient complained of PCA machine beeping frequently. Reminded patient to breathe when it sounds as her respiration drops when she goes to sleep. Advices her also that the machine can be d/c'd and other prn medication given but she declines and insisted on having the PCA even when told about the alarm beeping. Charge nurse and [facility] called to explain to patient but still could not comply. Dr. Franky Macho called via telephone and he ordered to d/c PCA and give IV Dilaudid injection instead. Patient told about new orders and was still upset as she does not want it to be d/c'd. Writer explained to her that if she keeps the pump, the alarm will keep sounding as her respiration drops while she is sleeping. Orders carried out. Monitored closely at this time.

## 2012-11-14 NOTE — Progress Notes (Signed)
Patient ID: Susan Sullivan, female   DOB: [birth year], 37 y.o.   MRN: [id number] Subjective:  The patient is alert and pleasant. She feels that she was overdosed on dilaudid. She recalls being wide awake but does not feel she could breath. She was given Narcan and now is having a lot of pain.  Objective: Vital signs in last 24 hours: Temp:  [96.9 F (36.1 C)-98.7 F (37.1 C)] 98.1 F (36.7 C) (06/26 0703) Pulse Rate:  [79-111] 111 (06/26 0703) Resp:  [11-27] 16 (06/26 0703) BP: (96-152)/(49-92) 119/64 mmHg (06/26 0703) SpO2:  [92 %-100 %] 96 % (06/26 0703) Weight:  [94.8 kg (208 lb 15.9 oz)] 94.8 kg (208 lb 15.9 oz) (06/25 1600)  Intake/Output from previous day: 06/25 0701 - 06/26 0700 In: 1000 [I.V.:1000] Out: 1825 [Urine:1475; Drains:50; Blood:200] Intake/Output this shift: Total I/O In: 120 [P.O.:120] Out: -   Physical exam the patient is alert and oriented. She is moving her lower extremities well.  Lab Results:  Recent Labs  11/14/12 0535  WBC 7.9  HGB 11.6*  HCT 34.9*  PLT 212   BMET  Recent Labs  11/14/12 0535  NA 141  K 3.0*  CL 105  CO2 30  GLUCOSE 102*  BUN 5*  CREATININE 0.69  CALCIUM 8.7    Studies/Results: Dg Lumbar Spine 2-3 Views  11/13/2012   *RADIOLOGY REPORT*  Clinical Data: L4-L5 PLIF.  DG C-ARM 1-60 MIN,LUMBAR SPINE - 2-3 VIEW  Comparison: Intraoperative views earlier the same date.  CT 10/07/2012.  Findings: Technique:  C-arm fluoroscopic images were obtained intraoperatively and submitted for postoperative interpretation. Please see the performing provider's procedural report for the fluoroscopy time utilized.  Spot fluoroscopic images of the lower lumbar spine are numbered as on the prior studies with a transitional L5 segment.  These images demonstrate the placement of bilateral pedicle screws at L4 and L5. Interconnecting rods have not yet been placed.  Interbody spacer appears well positioned.  The patient is status post fixation of  the right sacroiliac joint.  IMPRESSION: Intraoperative views during L4-L5 fusion as described.   Original Report Authenticated By: Carey Bullocks, M.D.   Dg Lumbar Spine 1 View  11/13/2012   *RADIOLOGY REPORT*  Clinical Data: Instrument localization for L4-L5 PLIF.  LUMBAR SPINE - 1 VIEW  Comparison: CT lumbar discogram.  Outside studies, including a lumbar MRI performed at [facility] 08/02/2012.  Findings: Numbering is based on the CT report and assumes L5 is transitional.  A single cross-table lateral view of the lumbar spine demonstrates skin spreaders posteriorly at L3.  A surgical instrument is directed towards the posterior aspect of the L3-L4 disc space.  IMPRESSION: L3-L4 intraoperative localization.   Original Report Authenticated By: Carey Bullocks, M.D.   Dg C-arm 1-60 Min  11/13/2012   *RADIOLOGY REPORT*  Clinical Data: L4-L5 PLIF.  DG C-ARM 1-60 MIN,LUMBAR SPINE - 2-3 VIEW  Comparison: Intraoperative views earlier the same date.  CT 10/07/2012.  Findings: Technique:  C-arm fluoroscopic images were obtained intraoperatively and submitted for postoperative interpretation. Please see the performing provider's procedural report for the fluoroscopy time utilized.  Spot fluoroscopic images of the lower lumbar spine are numbered as on the prior studies with a transitional L5 segment.  These images demonstrate the placement of bilateral pedicle screws at L4 and L5. Interconnecting rods have not yet been placed.  Interbody spacer appears well positioned.  The patient is status post fixation of the right sacroiliac joint.  IMPRESSION: Intraoperative views  during L4-L5 fusion as described.   Original Report Authenticated By: Carey Bullocks, M.D.    Assessment/Plan: Postop day 1: The patient is doing well neurologically. We will mobilize her with PT and OT.  Pain management: I will change her to a fentanyl PCA and discontinued the Dilaudid. The patient states she can take Percocet  but it makes her itch. We will continue to use Benadryl as needed.  LOS: 1 day     Susan Sullivan D 11/14/2012, 10:21 AM

## 2012-11-14 NOTE — Evaluation (Signed)
Occupational Therapy Evaluation Patient Details Name: Susan Sullivan MRN: [id number] DOB: [birth year] Today's Date: 11/14/2012 Time: 1478-2956 OT Time Calculation (min): 39 min  OT Assessment / Plan / Recommendation History of present illness 37 yo female admitted for PLIF   Clinical Impression   Pt presents to OT with significant pain which impacts her ability to perform BADLs; however, anticipate good progress as pain decreases.  Pt will benefit from OT for the below listed deficits,  to maximize safety and independence with BADLs to allow her to return home with family at supervision level.      OT Assessment  Patient needs continued OT Services    Follow Up Recommendations  No OT follow up;Supervision - Intermittent    Barriers to Discharge      Equipment Recommendations  3 in 1 bedside comode    Recommendations for Other Services    Frequency  Min 2X/week    Precautions / Restrictions Precautions Precautions: Back Precaution Comments: reviewed log roll technique and 3/3 back precautions Restrictions Weight Bearing Restrictions: No   Pertinent Vitals/Pain     ADL  Eating/Feeding: Independent Where Assessed - Eating/Feeding: Bed level Grooming: Teeth care;Min guard Where Assessed - Grooming: Supported standing Upper Body Bathing: Minimal assistance Where Assessed - Upper Body Bathing: Unsupported sitting;Supported sitting Lower Body Bathing: Maximal assistance Where Assessed - Lower Body Bathing: Supported sit to stand Upper Body Dressing: Minimal assistance Where Assessed - Upper Body Dressing: Unsupported sitting Lower Body Dressing: Maximal assistance Where Assessed - Lower Body Dressing: Supported sit to Pharmacist, hospital: Minimal assistance Toilet Transfer Method: Sit to stand;Stand pivot Toilet Transfer Equipment: Raised toilet seat with arms (or 3-in-1 over toilet) Toileting - Clothing Manipulation and Hygiene: Moderate assistance Where Assessed  - Toileting Clothing Manipulation and Hygiene: Standing Equipment Used: Back brace Transfers/Ambulation Related to ADLs: min A.  Pt only comfortable ambulating with assist from husband ADL Comments: Pt limited by pain.  Pt able to cross ankles over knees in supine.  Did not attempt to don/doff socks.  Husband very supportive    OT Diagnosis: Generalized weakness;Acute pain  OT Problem List: Decreased strength;Decreased activity tolerance;Decreased knowledge of use of DME or AE;Decreased knowledge of precautions;Pain OT Treatment Interventions: Self-care/ADL training;DME and/or AE instruction;Patient/family education   OT Goals(Current goals can be found in the care plan section) Acute Rehab OT Goals Patient Stated Goal: To decrease pain OT Goal Formulation: With patient Time For Goal Achievement: 11/21/12 Potential to Achieve Goals: Good  Visit Information  Last OT Received On: 11/14/12 Assistance Needed: +1 PT/OT Co-Evaluation/Treatment: Yes History of Present Illness: 37 yo female admitted for PLIF       Prior Functioning     Home Living Family/patient expects to be discharged to:: Private residence Living Arrangements: Spouse/significant other Available Help at Discharge: Family;Available 24 hours/day (initially) Type of Home: House Home Access: Stairs to enter [employer] of Steps: 4 Entrance Stairs-Rails: Right Home Layout: One level Home Equipment: Shower seat - built in Prior Function Level of Independence: Independent Comments: works as a Engineer, civil (consulting) at [employer]: No difficulties         Vision/Perception     Copywriter, advertising Arousal/Alertness: Awake/alert Behavior During Therapy: WFL for tasks assessed/performed Overall Cognitive Status: Within Functional Limits for tasks assessed    Extremity/Trunk Assessment Upper Extremity Assessment Upper Extremity Assessment: Overall WFL for tasks assessed Lower Extremity  Assessment Lower Extremity Assessment: Defer to PT evaluation RLE Deficits / Details: did not test because of pain  but patient reports RLE weaker than left and prior foot drop however during ambulation pt appears to clear right foot rather well at times (inconsistent) RLE: Unable to fully assess due to pain     Mobility Bed Mobility Bed Mobility: Rolling Left;Left Sidelying to Sit;Sit to Sidelying Left Rolling Left: 4: Min guard Left Sidelying to Sit: 4: Min assist Sit to Sidelying Left: 4: Min assist Details for Bed Mobility Assistance: min facilitation for follow through, pt holding onto therapists hand for assist Transfers Transfers: Sit to Stand;Stand to Sit Sit to Stand: 4: Min assist;With upper extremity assist;From bed Stand to Sit: 4: Min assist;With upper extremity assist;To bed Details for Transfer Assistance: bilateral HHA from husband to assist with lift off and stabilize as she stood, cues for safe hand placement and assist from husband to lower slowly to bed     Exercise     Balance     End of Session OT - End of Session Equipment Utilized During Treatment: Back brace Activity Tolerance: Patient limited by pain Patient left: in bed;with call bell/phone within reach;with family/visitor present Nurse Communication: Mobility status  GO     Consandra Laske M 11/14/2012, 4:50 PM

## 2012-11-14 NOTE — Progress Notes (Signed)
UR COMPLETED  

## 2012-11-15 MED ORDER — POTASSIUM CHLORIDE CRYS ER 20 MEQ PO TBCR
40.0000 meq | EXTENDED_RELEASE_TABLET | Freq: Three times a day (TID) | ORAL | Status: DC
  Administered 2012-11-15 – 2012-11-16 (×2): 40 meq via ORAL
  Filled 2012-11-15 (×6): qty 2

## 2012-11-15 MED ORDER — POLYETHYLENE GLYCOL 3350 17 G PO PACK
17.0000 g | PACK | Freq: Every day | ORAL | Status: DC | PRN
  Administered 2012-11-16: 17 g via ORAL
  Filled 2012-11-15: qty 1

## 2012-11-15 NOTE — Progress Notes (Signed)
Physical Therapy Treatment Patient Details Name: Nishat Livingston MRN: [id number] DOB: Nov 17, 1975 Today's Date: 11/15/2012 Time: 4098-1191 PT Time Calculation (min): 31 min  PT Assessment / Plan / Recommendation  PT Comments   Progressing well. Pain better controlled. Still relies on husband a lot however he provides great assistance. Has RW at home if husband is not there as she is still reaching for upper extremity support during gait.   Follow Up Recommendations  Outpatient PT (when cleared by surgeon)     Does the patient have the potential to tolerate intense rehabilitation     Barriers to Discharge        Equipment Recommendations  None recommended by PT    Recommendations for Other Services    Frequency Min 5X/week   Progress towards PT Goals Progress towards PT goals: Progressing toward goals  Plan Current plan remains appropriate    Precautions / Restrictions Precautions Precautions: Back Precaution Comments: reviewed log roll technique and 3/3 back precautions   Pertinent Vitals/Pain Reports minimal pain, had just been premedicated per RN    Mobility  Bed Mobility Bed Mobility: Rolling Right;Right Sidelying to Sit Rolling Right: 4: Min guard Right Sidelying to Sit: 4: Min guard;HOB elevated (40 degrees) Transfers Transfers: Sit to Stand;Stand to Sit Sit to Stand: 4: Min guard;With upper extremity assist;From bed Stand to Sit: 4: Min guard;With upper extremity assist;To chair/3-in-1 Details for Transfer Assistance: needed unilateral HHA initially upon standing to steady Ambulation/Gait Ambulation/Gait Assistance: 4: Min assist Assistive device: 1 person hand held assist Ambulation/Gait Assistance Details: LUE HHA, slow steady gait, limited by pain and fatigue Gait Pattern: Step-through pattern;Decreased stride length Gait velocity: decreased Stairs: Yes Stairs Assistance: 5: Supervision Stairs Assistance Details (indicate cue type and reason):  superivision because of lethargy however pt performing well Stair Management Technique: Two rails Number of Stairs: 10      PT Goals (current goals can now be found in the care plan section)    Visit Information  Last PT Received On: 11/15/12 Assistance Needed: +1 History of Present Illness: 37 yo female admitted for PLIF    Subjective Data      Cognition  Cognition Arousal/Alertness: Lethargic;Suspect due to medications Behavior During Therapy: [facility] for tasks assessed/performed Overall Cognitive Status: Within Functional Limits for tasks assessed    Balance     End of Session PT - End of Session Equipment Utilized During Treatment: Gait belt;Back brace Activity Tolerance: Patient tolerated treatment well Patient left: in chair;with call bell/phone within reach;with family/visitor present Nurse Communication: Mobility status   GP     [facility] HELEN 11/15/2012, 11:03 AM

## 2012-11-15 NOTE — Progress Notes (Signed)
Patient ID: Susan Sullivan, female   DOB: 1976/02/26, 37 y.o.   MRN: [id number] Subjective:  The patient is alert and pleasant. She looks and feels better this morning.  Objective: Vital signs in last 24 hours: Temp:  [98.6 F (37 C)-100.4 F (38 C)] 99.8 F (37.7 C) (06/27 0447) Pulse Rate:  [92-114] 114 (06/27 0447) Resp:  [13-22] 18 (06/27 0447) BP: (94-114)/(45-64) 104/60 mmHg (06/27 0447) SpO2:  [95 %-100 %] 97 % (06/27 0447)  Intake/Output from previous day: 06/26 0701 - 06/27 0700 In: 720 [P.O.:720] Out: 90 [Drains:90] Intake/Output this shift:    Physical exam the patient is alert and oriented. She is moving her lower extremities well.  Lab Results:  Recent Labs  11/14/12 0535  WBC 7.9  HGB 11.6*  HCT 34.9*  PLT 212   BMET  Recent Labs  11/14/12 0535  NA 141  K 3.0*  CL 105  CO2 30  GLUCOSE 102*  BUN 5*  CREATININE 0.69  CALCIUM 8.7    Studies/Results: Dg Lumbar Spine 2-3 Views  11/13/2012   *RADIOLOGY REPORT*  Clinical Data: L4-L5 PLIF.  DG C-ARM 1-60 MIN,LUMBAR SPINE - 2-3 VIEW  Comparison: Intraoperative views earlier the same date.  CT 10/07/2012.  Findings: Technique:  C-arm fluoroscopic images were obtained intraoperatively and submitted for postoperative interpretation. Please see the performing provider's procedural report for the fluoroscopy time utilized.  Spot fluoroscopic images of the lower lumbar spine are numbered as on the prior studies with a transitional L5 segment.  These images demonstrate the placement of bilateral pedicle screws at L4 and L5. Interconnecting rods have not yet been placed.  Interbody spacer appears well positioned.  The patient is status post fixation of the right sacroiliac joint.  IMPRESSION: Intraoperative views during L4-L5 fusion as described.   Original Report Authenticated By: Carey Bullocks, M.D.   Dg Lumbar Spine 1 View  11/13/2012   *RADIOLOGY REPORT*  Clinical Data: Instrument localization for L4-L5  PLIF.  LUMBAR SPINE - 1 VIEW  Comparison: CT lumbar discogram.  Outside studies, including a lumbar MRI performed at [facility] 08/02/2012.  Findings: Numbering is based on the CT report and assumes L5 is transitional.  A single cross-table lateral view of the lumbar spine demonstrates skin spreaders posteriorly at L3.  A surgical instrument is directed towards the posterior aspect of the L3-L4 disc space.  IMPRESSION: L3-L4 intraoperative localization.   Original Report Authenticated By: Carey Bullocks, M.D.   Dg C-arm 1-60 Min  11/13/2012   *RADIOLOGY REPORT*  Clinical Data: L4-L5 PLIF.  DG C-ARM 1-60 MIN,LUMBAR SPINE - 2-3 VIEW  Comparison: Intraoperative views earlier the same date.  CT 10/07/2012.  Findings: Technique:  C-arm fluoroscopic images were obtained intraoperatively and submitted for postoperative interpretation. Please see the performing provider's procedural report for the fluoroscopy time utilized.  Spot fluoroscopic images of the lower lumbar spine are numbered as on the prior studies with a transitional L5 segment.  These images demonstrate the placement of bilateral pedicle screws at L4 and L5. Interconnecting rods have not yet been placed.  Interbody spacer appears well positioned.  The patient is status post fixation of the right sacroiliac joint.  IMPRESSION: Intraoperative views during L4-L5 fusion as described.   Original Report Authenticated By: Carey Bullocks, M.D.    Assessment/Plan: Postop day #2: The patient is doing better. We will continue to mobilize her. She will likely go home tomorrow. I gave her her discharge instructions and answered all her questions.  We will discontinue her PCA pump.  Hypokalemia: Will give her potassium and recheck her BMP tomorrow.  LOS: 2 days     Cordie Beazley D 11/15/2012, 7:52 AM

## 2012-11-15 NOTE — Progress Notes (Signed)
At [phone] Pt.'s IV fluids, pca, and hemovac dc'd per order.  Pt tolerated removal of Hemovac without complaints.  Medicated with po pain meds, for comfort.  Zettie G[employer]ee [employer]

## 2012-11-15 NOTE — Progress Notes (Signed)
Came back to see patient on behalf of [facility] with [facility] Care Management. Declines any needs. Has contact information if needed. Raiford Noble, MSN-Ed, RN,BSN- [facility] [phone]

## 2012-11-15 NOTE — Progress Notes (Signed)
Occupational Therapy Treatment Patient Details Name: Susan Sullivan MRN: [id number] DOB: [birth year] Today's Date: 11/15/2012 Time: 4098-1191 OT Time Calculation (min): 11 min  OT Assessment / Plan / Recommendation  OT comments  Pt with limited activity this pm.  However, able to state all back precautions and safety with BADLs.  Reviewed safety with ADLs.  She agrees to ambulate with husband's assist later this pm   Follow Up Recommendations  No OT follow up;Supervision - Intermittent    Barriers to Discharge       Equipment Recommendations  None recommended by OT    Recommendations for Other Services    Frequency Min 2X/week   Progress towards OT Goals    Plan Discharge plan remains appropriate    Precautions / Restrictions Precautions Precautions: Back Precaution Comments: Reviewed back precautions.  Pt able to state 3/3 precautions       ADL  Lower Body Dressing: Supervision/safety Where Assessed - Lower Body Dressing: Supine, head of bed up;Rolling right and/or left ADL Comments: Pt supine with husband present.   She demonstrates ability to don/doff socks by crossing ankles over knees.  Reinforced that this is safe technique for LB ADLs.  Pt verbalizes understanding.  She deferred all other activity at this time.  Discussed safety with IADLs - no laundry, no changing bed linens, no sweeping or vacuuming.  She inquired when she may be able to lift her 53 y.o. son.  Instructed her to discuss with MD.  Reviewed lifting precautions, and instructed her to sit in stable chair, then invite son to sit next to her.  She verbalized understanding of all.     OT Diagnosis:    OT Problem List:   OT Treatment Interventions:     OT Goals(current goals can now be found in the care plan section) ADL Goals Pt Will Perform Lower Body Bathing: with supervision;with adaptive equipment;sit to/from stand Pt Will Perform Lower Body Dressing: with supervision;with adaptive equipment;sit  to/from stand Pt Will Transfer to Toilet: with supervision;ambulating;bedside commode;regular height toilet Pt Will Perform Toileting - Clothing Manipulation and hygiene: with supervision;sit to/from stand Additional ADL Goal #1: Pt will be independent with back precautions during BADLs and functional mobility  Visit Information  Last OT Received On: 11/15/12 Assistance Needed: +1 History of Present Illness: 37 yo female admitted for PLIF    Subjective Data      Prior Functioning       Cognition  Cognition Arousal/Alertness: Awake/alert Behavior During Therapy: WFL for tasks assessed/performed Overall Cognitive Status: Within Functional Limits for tasks assessed    Mobility       Exercises      Balance     End of Session OT - End of Session Activity Tolerance: Patient tolerated treatment well Patient left: in bed;with call bell/phone within reach;with family/visitor present  GO     Susan Sullivan M 11/15/2012, 4:58 PM

## 2012-11-16 LAB — BASIC METABOLIC PANEL
CO2: 33 mEq/L — ABNORMAL HIGH (ref 19–32)
Chloride: 101 mEq/L (ref 96–112)
Glucose, Bld: 122 mg/dL — ABNORMAL HIGH (ref 70–99)
Potassium: 3.1 mEq/L — ABNORMAL LOW (ref 3.5–5.1)
Sodium: 140 mEq/L (ref 135–145)

## 2012-11-16 MED ORDER — OXYCODONE-ACETAMINOPHEN 5-325 MG PO TABS: 1.0000 | ORAL_TABLET | ORAL | Status: DC | PRN

## 2012-11-16 MED ORDER — DIAZEPAM 5 MG PO TABS: 5.0000 mg | ORAL_TABLET | Freq: Four times a day (QID) | ORAL | Status: DC | PRN

## 2012-11-16 NOTE — Discharge Summary (Signed)
Physician Discharge Summary  Patient ID: Susan Sullivan MRN: [id number] DOB/AGE: December 25, 1975 37 y.o.  Admit date: 11/13/2012 Discharge date: 11/16/2012  Admission Diagnoses:L4-5 Degenerative disc disease, spinal stenosis compressing both the L4 and the L5 nerve roots; lumbago; lumbar radiculopathy      Discharge Diagnoses: L4-5 Degenerative disc disease, spinal stenosis compressing both the L4 and the L5 nerve roots; lumbago; lumbar radiculopathy     Active Problems:   * No active hospital problems. *   Discharged Condition: good  Hospital Course: pt admitted on day of surgery  - underwent procedure below  - pt up ambulating  - slowly imprioving - tolerating PO , voiding  Consults: None   Treatments: surgery:Bilateral L4 Laminotomy/foraminotomies to decompress the bilateral L4 and L5 nerve roots(the work required to do this was in addition to the work required to do the posterior lumbar interbody fusion because of the patient's spinal stenosis, facet arthropathy. Etc. requiring a wide decompression of the nerve roots.); L4-5 posterior lumbar interbody fusion with local morselized autograft bone and Actifusebone graft extender; insertion of interbody prosthesis at L4-5 (globus peek interbody prosthesis); posterior nonsegmental instrumentation from L4 to L5 with globus titanium pedicle screws and rods; posterior lateral arthrodesis at L4-5 with local morselized autograft bone and Vitoss bone graft extender.   Discharge Exam: Blood pressure 112/62, pulse 110, temperature 98.5 F (36.9 C), temperature source Oral, resp. rate 20, height 5\' 8"  (1.727 m), weight 94.8 kg (208 lb 15.9 oz), last menstrual period 11/05/2012, SpO2 99.00%. Wound:c/d/i  Disposition: home     Medication List    TAKE these medications       acyclovir 400 MG tablet  Commonly known as:  ZOVIRAX  Take 400 mg by mouth daily as needed. Cold sores     albuterol 108 (90 BASE) MCG/ACT inhaler  Commonly  known as:  PROAIR HFA  Inhale 2 puffs into the lungs every 6 (six) hours as needed for wheezing.     ALPRAZolam 0.25 MG tablet  Commonly known as:  XANAX  Take 1 tablet (0.25 mg total) by mouth 2 (two) times daily as needed for sleep.     aspirin 81 MG tablet  Take 81 mg by mouth daily.     cholecalciferol 1000 UNITS tablet  Commonly known as:  VITAMIN D  Take 1,000 Units by mouth daily.     cyclobenzaprine 10 MG tablet  Commonly known as:  FLEXERIL  Take 1 tablet (10 mg total) by mouth 3 (three) times daily as needed for muscle spasms.     diazepam 5 MG tablet  Commonly known as:  VALIUM  1 or 2 tablets at bedtime as needed for spasm     diazepam 5 MG tablet  Commonly known as:  VALIUM  Take 1 tablet (5 mg total) by mouth every 6 (six) hours as needed (spasms).     diphenhydrAMINE 25 MG tablet  Commonly known as:  BENADRYL  Take 25 mg by mouth every 6 (six) hours as needed for itching or allergies.     L-Methylfolate 15 MG Tabs  Take 1 tablet (15 mg total) by mouth daily.     metoprolol tartrate 25 MG tablet  Commonly known as:  LOPRESSOR  Take 12.5 mg by mouth 2 (two) times daily as needed. For tachycardia     montelukast 10 MG tablet  Commonly known as:  SINGULAIR  Take 1 tablet (10 mg total) by mouth at bedtime.     ondansetron 4 MG disintegrating tablet  Commonly known as:  ZOFRAN ODT  Take 1 tablet (4 mg total) by mouth every 8 (eight) hours as needed for nausea.     oxyCODONE-acetaminophen 5-325 MG per tablet  Commonly known as:  PERCOCET/ROXICET  Take 1-2 tablets by mouth every 4 (four) hours as needed.     ranitidine 300 MG tablet  Commonly known as:  ZANTAC  Take 1 tablet (300 mg total) by mouth at bedtime.     traMADol 50 MG tablet  Commonly known as:  ULTRAM  Take 50 mg by mouth every 6 (six) hours as needed for pain.     vitamin C 100 MG tablet  Take 100 mg by mouth daily.      ASK your doctor about these medications       cetirizine 10 MG  tablet  Commonly known as:  ZYRTEC  Take 1 tablet (10 mg total) by mouth daily.         Signed: Clydene Fake, MD 11/16/2012, 8:36 AM

## 2012-11-16 NOTE — Progress Notes (Signed)
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 Reinforced back precautions especially when she is to return to work. 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 Ambulating and transferring at modified independent level. Will have great support from husband. All goals met, no further acute PT needs.   Follow Up Recommendations  Outpatient PT (when cleared by surgeon)     Does the patient have the potential to tolerate intense rehabilitation     Barriers to Discharge        Equipment Recommendations  None recommended by PT    Recommendations for Other Services    Frequency Min 5X/week   Progress towards PT Goals Progress towards PT goals: Goals met/education completed, patient discharged from PT  Plan Current plan remains appropriate    Precautions / Restrictions Precautions Precautions: Back Precaution Comments: Reviewed back precautions.  Pt able to state 3/3 precautions   Pertinent Vitals/Pain Reports minimal pain after having received valium    Mobility  Bed Mobility Left Sidelying to Sit: 6: Modified independent (Device/Increase time);HOB elevated;With rails (30 degrees) Details for Bed Mobility Assistance: pt declined practicing from flat bed and reports no concerns with doing this at home Transfers Transfers: Sit to Stand;Stand to Sit Sit to Stand: 6: Modified independent (Device/Increase time);From bed;With upper extremity assist Stand to Sit: 6: Modified independent (Device/Increase time);To chair/3-in-1;With upper extremity assist Ambulation/Gait Ambulation/Gait Assistance: 6: Modified independent (Device/Increase time) Ambulation Distance (Feet): 150 Feet Assistive  device: None Ambulation/Gait Assistance Details: ambulating slowly but steady, occasionally reachign for upper extremity support but no conerns for LOB Gait Pattern: Wide base of support;Decreased stride length Stairs: No      PT Goals (current goals can now be found in the care plan section)  Goals have been met, patient d/c'd from PT services  Visit Information  Last PT Received On: 11/16/12 Assistance Needed: +1 History of Present Illness: 37 yo female admitted for PLIF    Subjective Data      Cognition  Cognition Arousal/Alertness: Awake/alert Behavior During Therapy: WFL for tasks assessed/performed Overall Cognitive Status: Within Functional Limits for tasks assessed    Balance     End of Session PT - End of Session Equipment Utilized During Treatment: Gait belt Activity Tolerance: Patient tolerated treatment well Patient left: in chair;with call bell/phone within reach;with family/visitor present Nurse Communication: Mobility status   GP     WHITLOW,Midge Momon HELEN 11/16/2012, 9:22 AM

## 2012-11-16 NOTE — Progress Notes (Signed)
Pt being discharged home.  Instructions given by Dr. Phoebe Perch, questions answered.  Then discharge instructions reinforced, back precautions.  Dressing to back changed. Incision intact, pt encouraged to cough and deep breath and to use IS.    Susan Sullivan [employer]

## 2012-11-20 ENCOUNTER — Inpatient Hospital Stay: Payer: Self-pay | Admitting: Student

## 2012-11-20 ENCOUNTER — Telehealth: Payer: Self-pay | Admitting: Internal Medicine

## 2012-11-20 LAB — URINALYSIS, COMPLETE
Bilirubin,UR: NEGATIVE
Glucose,UR: NEGATIVE mg/dL (ref 0–75)
Ph: 6 (ref 4.5–8.0)
Protein: NEGATIVE
RBC,UR: 1 /HPF (ref 0–5)
Specific Gravity: 1.011 (ref 1.003–1.030)
WBC UR: 4 /HPF (ref 0–5)

## 2012-11-20 LAB — COMPREHENSIVE METABOLIC PANEL
Albumin: 3.2 g/dL — ABNORMAL LOW (ref 3.4–5.0)
Alkaline Phosphatase: 116 U/L (ref 50–136)
Anion Gap: 3 — ABNORMAL LOW (ref 7–16)
BUN: 5 mg/dL — ABNORMAL LOW (ref 7–18)
Bilirubin,Total: 0.2 mg/dL (ref 0.2–1.0)
Chloride: 104 mmol/L (ref 98–107)
Co2: 32 mmol/L (ref 21–32)
Creatinine: 0.73 mg/dL (ref 0.60–1.30)
EGFR (African American): >60
EGFR (Non-African Amer.): >60
Glucose: 113 mg/dL — ABNORMAL HIGH (ref 65–99)
Osmolality: 276 (ref 275–301)
Total Protein: 8.1 g/dL (ref 6.4–8.2)

## 2012-11-20 LAB — CBC
HCT: 33.3 % — ABNORMAL LOW (ref 35.0–47.0)
MCH: 30.1 pg (ref 26.0–34.0)
MCHC: 33.7 g/dL (ref 32.0–36.0)
MCV: 89 fL (ref 80–100)
Platelet: 342 10*3/uL (ref 150–440)
RDW: 13.2 % (ref 11.5–14.5)
WBC: 7.9 10*3/uL (ref 3.6–11.0)

## 2012-11-20 NOTE — Telephone Encounter (Signed)
Increased back pain after back surgery could mean infected surgical site,  She needs to call her neurosurgeon.

## 2012-11-20 NOTE — Telephone Encounter (Signed)
Patient Information:  Caller Name: Susan Sullivan  Phone: [phone]  Patient: Susan Sullivan  Gender: Female  DOB: 1975/06/07  Age: 37 Years  PCP: Duncan Dull (Adults only)  Pregnant: No  Office Follow Up:  Does the office need to follow up with this patient?: Yes  Instructions For The Office: Pt  called concerning increasing back pain since L4 Fusion 11/12/2012 and wanted to see Dr. Darrick Huntsman and get her  advice. RN advised  pt to proceed to ED concerning back pain along with urinary symptoms and feeling feverish. Pt declined going to ER and mentioned she may call Neurosurgeon's office, but would like Dr. Darrick Huntsman to know and advise.  RN Note:  Is able to pass urine, but has to push to get urine to pass. Has also had some intermittent abdominal pain, but denies this morning.  While in the hospital she had temperatures   100.3-100.8, and feels feverish this morning.  RN advised to proceed to ED for further evaluation per nursing judgement,. Pt stating she "isn't going to ER for this" unless gets worse,  and may try calling [facility] office.  Symptoms  Reason For Call & Symptoms: Today, 11/20/2012, pt calling stating she had  L4 Fusion on   June 24 , discharged 11/13/2012,  and has  had increased pain since ~ 11/18/2012 . Last night she took   Percocet  and Valium   q 2 hours  , and still having alot of pain.  Rating pain 7-8/10.  Pt  saw her Neurosurgeon 11/15/2012 and knew he was going to be out of town and hasn't tried  calling  the office, and  wants to see Dr Darrick Huntsman.  Reviewed Health History In EMR: Yes  Reviewed Medications In EMR: Yes  Reviewed Allergies In EMR: Yes  Reviewed Surgeries / Procedures: Yes  Date of Onset of Symptoms: 11/19/2012  Treatments Tried: Percocet, Valium,  Treatments Tried Worked: No  Any Fever: Yes  Fever Taken: Tactile  Fever Time Of Reading: 07:57:23  Fever Last Reading: N/A OB / GYN:  LMP: 11/17/2012  Guideline(s) Used:  Back Pain  Disposition Per  Guideline:   Go to ED Now  Reason For Disposition Reached:   Unable to urinate (or only a few drops) and bladder feels very full  Advice Given:  N/A  Patient Refused Recommendation:  Patient Refused Care Advice  Pt stated if symptoms worsen she will go to ED, and may try calling Neurosurgeon office

## 2012-11-20 NOTE — Telephone Encounter (Signed)
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

## 2012-11-20 NOTE — Telephone Encounter (Signed)
Patient stated she will call neurosurgeon's office. Will call back and verify.

## 2012-11-21 LAB — CBC WITH DIFFERENTIAL/PLATELET
Basophil #: 0 10*3/uL (ref 0.0–0.1)
Eosinophil %: 4.9 %
HCT: 30 % — ABNORMAL LOW (ref 35.0–47.0)
HGB: 10.2 g/dL — ABNORMAL LOW (ref 12.0–16.0)
Lymphocyte #: 1.9 10*3/uL (ref 1.0–3.6)
Lymphocyte %: 35.7 %
MCH: 30.2 pg (ref 26.0–34.0)
MCHC: 34 g/dL (ref 32.0–36.0)
MCV: 89 fL (ref 80–100)
Monocyte #: 0.5 x10 3/mm (ref 0.2–0.9)
Monocyte %: 8.5 %
Neutrophil #: 2.7 10*3/uL (ref 1.4–6.5)
RBC: 3.38 10*6/uL — ABNORMAL LOW (ref 3.80–5.20)
RDW: 13.3 % (ref 11.5–14.5)
WBC: 5.4 10*3/uL (ref 3.6–11.0)

## 2012-11-21 LAB — COMPREHENSIVE METABOLIC PANEL
Alkaline Phosphatase: 100 U/L (ref 50–136)
Bilirubin,Total: 0.2 mg/dL (ref 0.2–1.0)
Calcium, Total: 8.7 mg/dL (ref 8.5–10.1)
Chloride: 105 mmol/L (ref 98–107)
Creatinine: 0.68 mg/dL (ref 0.60–1.30)
EGFR (Non-African Amer.): >60
Glucose: 116 mg/dL — ABNORMAL HIGH (ref 65–99)
SGOT(AST): 42 U/L — ABNORMAL HIGH (ref 15–37)
SGPT (ALT): 62 U/L (ref 12–78)
Total Protein: 7.1 g/dL (ref 6.4–8.2)

## 2012-11-21 LAB — HEMOGLOBIN A1C: Hemoglobin A1C: 5.4 % (ref 4.2–6.3)

## 2012-11-21 NOTE — Telephone Encounter (Signed)
Thought you would want to know patient in ICU at [facility] air bubbles in spine, I had called earlier so her husband  Helped her return call.

## 2012-11-21 NOTE — Telephone Encounter (Signed)
Called to verify how patient is feeling today no answer left message to return call.

## 2012-11-22 LAB — CBC WITH DIFFERENTIAL/PLATELET
Basophil %: 0.7 %
HGB: 9.7 g/dL — ABNORMAL LOW (ref 12.0–16.0)
Lymphocyte %: 32.5 %
MCH: 30.4 pg (ref 26.0–34.0)
MCV: 89 fL (ref 80–100)
Monocyte %: 7.3 %
Neutrophil #: 3.6 10*3/uL (ref 1.4–6.5)
Platelet: 317 10*3/uL (ref 150–440)
WBC: 6.6 10*3/uL (ref 3.6–11.0)

## 2012-11-23 ENCOUNTER — Encounter (HOSPITAL_COMMUNITY): Payer: Self-pay

## 2012-11-23 ENCOUNTER — Inpatient Hospital Stay (HOSPITAL_COMMUNITY)
Admission: AD | Admit: 2012-11-23 | Discharge: 2012-11-25 | DRG: 948 | Disposition: A | Discharge status: Home / Self Care | Payer: 59 | Source: Other Acute Inpatient Hospital | Attending: Neurosurgery | Admitting: Neurosurgery

## 2012-11-23 DIAGNOSIS — Z888 Allergy status to other drugs, medicaments and biological substances status: Secondary | ICD-10-CM

## 2012-11-23 DIAGNOSIS — Z91013 Allergy to seafood: Secondary | ICD-10-CM

## 2012-11-23 DIAGNOSIS — Z823 Family history of stroke: Secondary | ICD-10-CM

## 2012-11-23 DIAGNOSIS — Z8261 Family history of arthritis: Secondary | ICD-10-CM

## 2012-11-23 DIAGNOSIS — Z79899 Other long term (current) drug therapy: Secondary | ICD-10-CM

## 2012-11-23 DIAGNOSIS — G8929 Other chronic pain: Secondary | ICD-10-CM | POA: Diagnosis present

## 2012-11-23 DIAGNOSIS — Z91012 Allergy to eggs: Secondary | ICD-10-CM

## 2012-11-23 DIAGNOSIS — Z91018 Allergy to other foods: Secondary | ICD-10-CM

## 2012-11-23 DIAGNOSIS — Z91040 Latex allergy status: Secondary | ICD-10-CM

## 2012-11-23 DIAGNOSIS — Z881 Allergy status to other antibiotic agents status: Secondary | ICD-10-CM

## 2012-11-23 DIAGNOSIS — G8918 Other acute postprocedural pain: Principal | ICD-10-CM | POA: Diagnosis present

## 2012-11-23 DIAGNOSIS — Z9089 Acquired absence of other organs: Secondary | ICD-10-CM

## 2012-11-23 DIAGNOSIS — Z7982 Long term (current) use of aspirin: Secondary | ICD-10-CM

## 2012-11-23 DIAGNOSIS — Z8249 Family history of ischemic heart disease and other diseases of the circulatory system: Secondary | ICD-10-CM

## 2012-11-23 DIAGNOSIS — F411 Generalized anxiety disorder: Secondary | ICD-10-CM | POA: Diagnosis present

## 2012-11-23 DIAGNOSIS — Z981 Arthrodesis status: Secondary | ICD-10-CM

## 2012-11-23 DIAGNOSIS — IMO0002 Reserved for concepts with insufficient information to code with codable children: Secondary | ICD-10-CM | POA: Diagnosis present

## 2012-11-23 DIAGNOSIS — Z91010 Allergy to peanuts: Secondary | ICD-10-CM

## 2012-11-23 DIAGNOSIS — Z833 Family history of diabetes mellitus: Secondary | ICD-10-CM

## 2012-11-23 LAB — CBC WITH DIFFERENTIAL/PLATELET
Basophil %: 0.6 %
Basophils Absolute: 0 10*3/uL (ref 0.0–0.1)
Basophils Relative: 1 % (ref 0–1)
Eosinophil #: 0.3 10*3/uL (ref 0.0–0.7)
Eosinophils Absolute: 0.4 10*3/uL (ref 0.0–0.7)
Eosinophils Relative: 5 % (ref 0–5)
HCT: 30.4 % — ABNORMAL LOW (ref 35.0–47.0)
HCT: 32.3 % — ABNORMAL LOW (ref 36.0–46.0)
HGB: 10.3 g/dL — ABNORMAL LOW (ref 12.0–16.0)
Hemoglobin: 10.5 g/dL — ABNORMAL LOW (ref 12.0–15.0)
Lymphocyte #: 2.4 10*3/uL (ref 1.0–3.6)
Lymphocyte %: 34.4 %
Lymphocytes Relative: 34 % (ref 12–46)
Lymphs Abs: 2.2 10*3/uL (ref 0.7–4.0)
MCH: 29.7 pg (ref 26.0–34.0)
MCHC: 32.5 g/dL (ref 30.0–36.0)
MCV: 90 fL (ref 80–100)
MCV: 91.2 fL (ref 78.0–100.0)
Monocyte #: 0.4 x10 3/mm (ref 0.2–0.9)
Monocytes Absolute: 0.6 10*3/uL (ref 0.1–1.0)
Monocytes Relative: 9 % (ref 3–12)
Neutro Abs: 3.4 10*3/uL (ref 1.7–7.7)
Neutrophil #: 3.8 10*3/uL (ref 1.4–6.5)
Neutrophil %: 54.1 %
Neutrophils Relative %: 52 % (ref 43–77)
Platelet: 335 10*3/uL (ref 150–440)
Platelets: 341 10*3/uL (ref 150–400)
RBC: 3.39 10*6/uL — ABNORMAL LOW (ref 3.80–5.20)
RBC: 3.54 MIL/uL — ABNORMAL LOW (ref 3.87–5.11)
RDW: 13.4 % (ref 11.5–14.5)
RDW: 13.1 % (ref 11.5–15.5)
WBC: 7 10*3/uL (ref 3.6–11.0)
WBC: 6.6 10*3/uL (ref 4.0–10.5)

## 2012-11-23 LAB — BASIC METABOLIC PANEL
BUN: 4 mg/dL — ABNORMAL LOW (ref 6–23)
CO2: 26 mEq/L (ref 19–32)
Calcium: 9.2 mg/dL (ref 8.4–10.5)
Chloride: 103 mEq/L (ref 96–112)
Creatinine, Ser: 0.58 mg/dL (ref 0.50–1.10)
GFR calc Af Amer: >90 mL/min (ref >90)
GFR calc non Af Amer: >90 mL/min (ref >90)
Glucose, Bld: 78 mg/dL (ref 70–99)
Potassium: 3.6 mEq/L (ref 3.5–5.1)
Sodium: 142 mEq/L (ref 135–145)

## 2012-11-23 MED ORDER — LORATADINE 10 MG PO TABS
10.0000 mg | ORAL_TABLET | Freq: Every day | ORAL | Status: DC
  Administered 2012-11-23 – 2012-11-25 (×3): 10 mg via ORAL
  Filled 2012-11-23 (×3): qty 1

## 2012-11-23 MED ORDER — ACETAMINOPHEN 325 MG PO TABS
650.0000 mg | ORAL_TABLET | ORAL | Status: DC | PRN
  Filled 2012-11-23: qty 2

## 2012-11-23 MED ORDER — ALUM & MAG HYDROXIDE-SIMETH 200-200-20 MG/5ML PO SUSP: 30.0000 mL | Freq: Four times a day (QID) | ORAL | Status: DC | PRN

## 2012-11-23 MED ORDER — MORPHINE SULFATE 4 MG/ML IJ SOLN
INTRAMUSCULAR | Status: AC
  Filled 2012-11-23: qty 1

## 2012-11-23 MED ORDER — OXYCODONE HCL 5 MG PO TABS
5.0000 mg | ORAL_TABLET | ORAL | Status: DC | PRN
  Administered 2012-11-23 – 2012-11-25 (×9): 10 mg via ORAL
  Filled 2012-11-23 (×10): qty 2

## 2012-11-23 MED ORDER — CYCLOBENZAPRINE HCL 10 MG PO TABS
10.0000 mg | ORAL_TABLET | Freq: Three times a day (TID) | ORAL | Status: DC | PRN
  Administered 2012-11-23 – 2012-11-25 (×2): 10 mg via ORAL
  Filled 2012-11-23 (×2): qty 1

## 2012-11-23 MED ORDER — ASPIRIN EC 81 MG PO TBEC
81.0000 mg | DELAYED_RELEASE_TABLET | Freq: Every day | ORAL | Status: DC
  Administered 2012-11-23 – 2012-11-25 (×3): 81 mg via ORAL
  Filled 2012-11-23 (×3): qty 1

## 2012-11-23 MED ORDER — SODIUM CHLORIDE 0.9 % IV SOLN
250.0000 mL | INTRAVENOUS | Status: DC
  Administered 2012-11-23: 20 mL via INTRAVENOUS

## 2012-11-23 MED ORDER — ASPIRIN 81 MG PO TABS: 81.0000 mg | ORAL_TABLET | Freq: Every day | ORAL | Status: DC

## 2012-11-23 MED ORDER — MONTELUKAST SODIUM 10 MG PO TABS
10.0000 mg | ORAL_TABLET | Freq: Every day | ORAL | Status: DC
  Administered 2012-11-23 – 2012-11-24 (×2): 10 mg via ORAL
  Filled 2012-11-23 (×3): qty 1

## 2012-11-23 MED ORDER — MENTHOL 3 MG MT LOZG: 1.0000 | LOZENGE | OROMUCOSAL | Status: DC | PRN

## 2012-11-23 MED ORDER — KCL IN DEXTROSE-NACL 20-5-0.45 MEQ/L-%-% IV SOLN
INTRAVENOUS | Status: DC
  Administered 2012-11-23: 17:00:00 via INTRAVENOUS
  Filled 2012-11-23 (×4): qty 1000

## 2012-11-23 MED ORDER — FAMOTIDINE 40 MG PO TABS
40.0000 mg | ORAL_TABLET | Freq: Every day | ORAL | Status: DC
  Administered 2012-11-23 – 2012-11-24 (×2): 40 mg via ORAL
  Filled 2012-11-23 (×3): qty 1

## 2012-11-23 MED ORDER — DIAZEPAM 5 MG PO TABS
5.0000 mg | ORAL_TABLET | Freq: Four times a day (QID) | ORAL | Status: DC | PRN
  Administered 2012-11-23 – 2012-11-25 (×5): 5 mg via ORAL
  Filled 2012-11-23 (×6): qty 1

## 2012-11-23 MED ORDER — ACETAMINOPHEN 650 MG RE SUPP: 650.0000 mg | RECTAL | Status: DC | PRN

## 2012-11-23 MED ORDER — SODIUM CHLORIDE 0.9 % IJ SOLN: 3.0000 mL | INTRAMUSCULAR | Status: DC | PRN

## 2012-11-23 MED ORDER — HEPARIN SODIUM (PORCINE) 5000 UNIT/ML IJ SOLN
5000.0000 [IU] | Freq: Two times a day (BID) | INTRAMUSCULAR | Status: DC
  Administered 2012-11-23 – 2012-11-24 (×4): 5000 [IU] via SUBCUTANEOUS
  Filled 2012-11-23 (×6): qty 1

## 2012-11-23 MED ORDER — HYDROXYZINE HCL 25 MG PO TABS: 50.0000 mg | ORAL_TABLET | ORAL | Status: DC | PRN

## 2012-11-23 MED ORDER — ACYCLOVIR 400 MG PO TABS: 400.0000 mg | ORAL_TABLET | Freq: Every day | ORAL | Status: DC | PRN

## 2012-11-23 MED ORDER — DIPHENHYDRAMINE HCL 25 MG PO TABS
25.0000 mg | ORAL_TABLET | Freq: Four times a day (QID) | ORAL | Status: DC | PRN
Start: 2012-11-23 — End: 2012-11-25
  Filled 2012-11-23: qty 1

## 2012-11-23 MED ORDER — BISACODYL 10 MG RE SUPP: 10.0000 mg | Freq: Every day | RECTAL | Status: DC | PRN

## 2012-11-23 MED ORDER — KETOROLAC TROMETHAMINE 30 MG/ML IJ SOLN
30.0000 mg | Freq: Four times a day (QID) | INTRAMUSCULAR | Status: AC
  Administered 2012-11-23 – 2012-11-25 (×8): 30 mg via INTRAVENOUS
  Filled 2012-11-23 (×8): qty 1

## 2012-11-23 MED ORDER — SODIUM CHLORIDE 0.9 % IJ SOLN
3.0000 mL | Freq: Two times a day (BID) | INTRAMUSCULAR | Status: DC
  Administered 2012-11-24 (×2): 3 mL via INTRAVENOUS

## 2012-11-23 MED ORDER — L-METHYLFOLATE 15 MG PO TABS: 1.0000 | ORAL_TABLET | Freq: Every day | ORAL | Status: DC

## 2012-11-23 MED ORDER — PHENOL 1.4 % MT LIQD: 1.0000 | OROMUCOSAL | Status: DC | PRN

## 2012-11-23 MED ORDER — ONDANSETRON HCL 4 MG/2ML IJ SOLN: 4.0000 mg | Freq: Four times a day (QID) | INTRAMUSCULAR | Status: DC | PRN

## 2012-11-23 MED ORDER — ACETAMINOPHEN 10 MG/ML IV SOLN
1000.0000 mg | Freq: Four times a day (QID) | INTRAVENOUS | Status: AC
  Administered 2012-11-23 – 2012-11-24 (×4): 1000 mg via INTRAVENOUS
  Filled 2012-11-23 (×4): qty 100

## 2012-11-23 MED ORDER — MAGNESIUM HYDROXIDE 400 MG/5ML PO SUSP
30.0000 mL | Freq: Every day | ORAL | Status: DC | PRN
  Filled 2012-11-23: qty 30

## 2012-11-23 MED ORDER — POLYETHYLENE GLYCOL 3350 17 G PO PACK
17.0000 g | PACK | Freq: Every day | ORAL | Status: DC
  Administered 2012-11-23 – 2012-11-25 (×3): 17 g via ORAL
  Filled 2012-11-23 (×3): qty 1

## 2012-11-23 MED ORDER — ALBUTEROL SULFATE HFA 108 (90 BASE) MCG/ACT IN AERS
2.0000 | INHALATION_SPRAY | Freq: Four times a day (QID) | RESPIRATORY_TRACT | Status: DC | PRN
  Filled 2012-11-23: qty 6.7

## 2012-11-23 MED ORDER — ZOLPIDEM TARTRATE 5 MG PO TABS: 5.0000 mg | ORAL_TABLET | Freq: Every evening | ORAL | Status: DC | PRN

## 2012-11-23 MED ORDER — MORPHINE SULFATE 4 MG/ML IJ SOLN
4.0000 mg | INTRAMUSCULAR | Status: DC | PRN
  Administered 2012-11-23: 4 mg via INTRAMUSCULAR

## 2012-11-23 NOTE — Progress Notes (Signed)
Nutrition Brief Note  RD contacted by pt's RN regarding patient's food choices given multiple food allergies. This RD familiar with patient from previous admission. RD alerted Patient Nutritional therapist of patient's admission, as the [employer] spoke with pt extensively during her previous admission re: her food allergies.   Per pt, she is allergic to: clams, oysters, peanuts and peanut-containing products, red meat and pork, eggs and egg-containing products, raw fruits and veggies (except tomatoes and iceberg lettuce), soy products (except Ranch dressing.) Pt and husband are competent in choosing foods that patient is able to safely consume, allow pt to order whatever foods she believes she can consume.  BMI is 31.8. Patient meets criteria for Obesity Class I based on current BMI.   Current diet order is Regular. Labs and medications reviewed.   No nutrition interventions warranted at this time. If nutrition issues arise, please consult RD. If foodservice issues arise - please Community education officer.  Jarold Motto MS, RD, LDN Pager: [phone] After-hours pager: [phone]

## 2012-11-23 NOTE — H&P (Signed)
Subjective: Patient is a 37 y.o. female who is admitted in transfer from [facility] for treatment of intractable postoperative pain. Patient is under the care of Dr. Tressie Stalker who on 11/13/2012 performed a L4-5 lumbar decompression, PLIF, and PLA. Patient was discharged to home on 11/16/2012 with prescriptions for Valium and Percocet. She had disabling pain and eventually presented to the [facility] emergency room and was admitted for pain management. She was studied with CT scan at [facility], which I reviewed, and which shows the screws and interbody implants all in good position, and an otherwise typical postoperative appearance. Patient was managed at [facility] with a fentanyl PCA but the patient's hospitalist Dr. Jacques Navy felt that he was not making progress with her pain management and requested transfer back to Dr. Lovell Sheehan.  Patient was accepted in transfer.   Symptomatically she complains of midline back pain essentially in the area of the incision and surgery. She notes that she's had good relief of the right lumbar radicular pain she had prior surgery.   Patient Active Problem List   Diagnosis Date Noted  . Anxiety state, unspecified 08/06/2012  . Tachycardia 08/06/2012  . 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 Infectious mononucleosis 03/29/2012  . Colicky RUQ abdominal pain 02/25/2012  . Back pain, thoracic 02/23/2012  . Elevated blood pressure reading without diagnosis of hypertension 01/07/2012  . 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 Oral allergy syndrome 05/14/2011  . Pre-conception counseling 05/14/2011  . Lipids abnormal 05/14/2011  . 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 History of ectopic pregnancy   . Spinal stenosis of lumbar region    Past Medical History  Diagnosis Date  . Ectopic pregnancy, tubal Sept 2012    left,  with rupture ,  and concurrent right ovarian cyst rupture  . Anaphylaxis due  to latex   . 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 MTHFR (methylene THF reductase) deficiency and homocystinuria   . Complication of anesthesia     ketamin - difficulty waking up  . Dysrhythmia     tachycardia - sees dr. Kirke Corin  . Gestational diabetes     "only when I'm pregnant" (11/13/2012)  . Degenerative disk disease     not on medication due to attempts to get pregant  . Chronic lower back pain     Past Surgical History  Procedure Laterality Date  . Cesarean section  2002; 2010  . Dilation and curettage of uterus  2012; 2013  . Spine surgery  2007    SI joint fusion [facility]  . Cholecystectomy  Dec 2012    Jason Dew, [facility]  . Posterior lumbar fusion  11/13/2012  . Augmentation mammaplasty Bilateral 2007  . Laparoscopy for ectopic pregnancy  2012    Prescriptions prior to admission  Medication Sig Dispense Refill  . acyclovir (ZOVIRAX) 400 MG tablet Take 400 mg by mouth daily as needed. Cold sores      . albuterol (PROAIR HFA) 108 (90 BASE) MCG/ACT inhaler Inhale 2 puffs into the lungs every 6 (six) hours as needed for wheezing.  6.7 g  11  . ALPRAZolam (XANAX) 0.25 MG tablet Take 1 tablet (0.25 mg total) by mouth 2 (two) times daily as needed for sleep.  20 tablet  0  . Ascorbic Acid (VITAMIN C) 100 MG tablet Take 100 mg by mouth daily.      [name] aspirin 81 MG tablet Take 81 mg by mouth daily.      [name]  cetirizine (ZYRTEC) 10 MG tablet Take 1 tablet (10 mg total) by mouth daily.  90 tablet  3  . cholecalciferol (VITAMIN D) 1000 UNITS tablet Take 1,000 Units by mouth daily.      . cyclobenzaprine (FLEXERIL) 10 MG tablet Take 1 tablet (10 mg total) by mouth 3 (three) times daily as needed for muscle spasms.  60 tablet  2  . diazepam (VALIUM) 5 MG tablet 1 or 2 tablets at bedtime as needed for spasm  60 tablet  1  . diazepam (VALIUM) 5 MG tablet Take 1 tablet (5 mg total) by mouth every 6 (six) hours as needed (spasms).  51 tablet  0  . diphenhydrAMINE (BENADRYL) 25 MG tablet Take  25 mg by mouth every 6 (six) hours as needed for itching or allergies.      [name] L-Methylfolate 15 MG TABS Take 1 tablet (15 mg total) by mouth daily.  30 tablet  6  . metoprolol tartrate (LOPRESSOR) 25 MG tablet Take 12.5 mg by mouth 2 (two) times daily as needed. For tachycardia      . montelukast (SINGULAIR) 10 MG tablet Take 1 tablet (10 mg total) by mouth at bedtime.  90 tablet  3  . ondansetron (ZOFRAN ODT) 4 MG disintegrating tablet Take 1 tablet (4 mg total) by mouth every 8 (eight) hours as needed for nausea.  20 tablet  0  . oxyCODONE-acetaminophen (PERCOCET/ROXICET) 5-325 MG per tablet Take 1-2 tablets by mouth every 4 (four) hours as needed.  61 tablet  0  . ranitidine (ZANTAC) 300 MG tablet Take 1 tablet (300 mg total) by mouth at bedtime.  90 tablet  3  . traMADol (ULTRAM) 50 MG tablet Take 50 mg by mouth every 6 (six) hours as needed for pain.       Allergies  Allergen Reactions  . Clams (Shellfish Allergy) Nausea And Vomiting  . Clindamycin/Lincomycin Anaphylaxis  . Latex Anaphylaxis  . Peanut-Containing Drug Products Anaphylaxis  . Peanuts (Peanut Oil) Anaphylaxis  . Spinach Nausea And Vomiting  . Celebrex (Celecoxib) Other (See Comments)    Insomnia   . Cymbalta (Duloxetine Hcl) Other (See Comments)    Hyperactivity.   . Dilaudid (Hydromorphone)     Patient worried it caused anaphylaxis  . Eggs Or Egg-Derived Products     Also raw fruits/veggies  . Gabapentin Other (See Comments)    Hyperacitivity.   . Meat Extract Nausea And Vomiting    Red meat and pork.   . Nubain (Nalbuphine Hcl) Itching  . Other     Narcotics-rash, itching; all nuts; peas; lemons (anaphylaxis); melon  . Propofol Diarrhea    Never had issue with prior surgeries receiving this  . Soy Allergy   . Sudafed (Pseudoephedrine Hcl) Other (See Comments)    Tachycardia     History  Substance Use Topics  . Smoking status: Never Smoker   . Smokeless tobacco: Never Used  . Alcohol Use: No    Family  History  Problem Relation Age of Onset  . Arthritis Mother   . Hyperlipidemia Mother   . Heart disease Mother   . Diabetes Mother   . Arthritis Father   . Hyperlipidemia Father   . Heart disease Father 89    CABG   . Diabetes Father   . Stroke Paternal Grandmother   . Heart disease Paternal Grandmother   . Heart disease Paternal Grandfather      Review of Systems A comprehensive review of systems was negative.  Objective: Vital signs in last 24 hours: Temp:  [98.3 F (36.8 C)] 98.3 F (36.8 C) (07/05 1220) Pulse Rate:  [109] 109 (07/05 1220) Resp:  [20] 20 (07/05 1220) BP: (127)/(89) 127/89 mmHg (07/05 1220) SpO2:  [99 %] 99 % (07/05 1220)  EXAM:  Patient is a well-developed well-nourished white female who appears in discomfort, but no acute distress. Lungs are clear to auscultation , the patient has symmetrical respiratory excursion. Heart has a regular rate and rhythm normal S1 and S2 no murmur.   Abdomen is soft nontender nondistended bowel sounds are present. Extremity examination shows no clubbing cyanosis or edema. Wound is healing well, is clean dry, there is no erythema, swelling, or drainage. Motor examination shows 5 over 5 strength in the lower extremities including the iliopsoas quadriceps dorsiflexor extensor hallicus  longus and plantar flexor bilaterally. Sensation is intact to pinprick in the distal lower extremities. Reflexes are symmetrical bilaterally. No pathologic reflexes are present.   Data Review:CBC    Component Value Date/Time   WBC 7.9 11/14/2012 0535   RBC 3.83* 11/14/2012 0535   HGB 11.6* 11/14/2012 0535   HCT 34.9* 11/14/2012 0535   PLT 212 11/14/2012 0535   MCV 91.1 11/14/2012 0535   MCH 30.3 11/14/2012 0535   MCHC 33.2 11/14/2012 0535   RDW 13.5 11/14/2012 0535   LYMPHSABS 2.1 05/01/2012 0930   MONOABS 0.4 05/01/2012 0930   EOSABS 0.1 05/01/2012 0930   BASOSABS 0.0 05/01/2012 0930                          BMET    Component Value Date/Time    NA 140 11/16/2012 0605   K 3.1* 11/16/2012 0605   CL 101 11/16/2012 0605   CO2 33* 11/16/2012 0605   GLUCOSE 122* 11/16/2012 0605   BUN 4* 11/16/2012 0605   CREATININE 0.66 11/16/2012 0605   CALCIUM 8.6 11/16/2012 0605   GFRNONAA >90 11/16/2012 0605   GFRAA >90 11/16/2012 0605     Assessment/Plan: Patient about a week and a half status post lumbar decompression and arthrodesis by Dr. Tressie Stalker who is readmitted (having been transferred from [facility]) for treatment of disabling low back pain. I explained the patient and her husband that our plan will be to try to get some immediate pain control and then develop a pain management regimen and that she'll be able to transition to home. We will use OxyIR when necessary, morphine IM when necessary, 24 hours of IV acetaminophen, 48 hours of IV Toradol, Valium 5 mg every 6 hours, and Flexeril 10 mg 3 times a day when necessary. We will make adjustments as necessary. I discussed plan with the patient, her husband, and the nursing staff.   Hewitt Shorts, MD 11/23/2012 12:48 PM

## 2012-11-23 NOTE — Progress Notes (Signed)
PHARMACIST - PHYSICIAN ORDER COMMUNICATION  CONCERNING: P&T Medication Policy on Herbal Medications  DESCRIPTION:  This patient's order for:  L-methylfolate  has been noted.  This product(s) is classified as an "herbal" or natural product. Due to a lack of definitive safety studies or FDA approval, nonstandard manufacturing practices, plus the potential risk of unknown drug-drug interactions while on inpatient medications, the Pharmacy and Therapeutics Committee does not permit the use of "herbal" or natural products of this type within [facility].   ACTION TAKEN: The pharmacy department is unable to verify this order at this time and your patient has been informed of this safety policy. Please reevaluate patient's clinical condition at discharge and address if the herbal or natural product(s) should be resumed at that time.   Marisue Humble, PharmD Clinical Pharmacist [facility]

## 2012-11-24 NOTE — Progress Notes (Signed)
Filed Vitals:   11/23/12 1804 11/23/12 2200 11/24/12 0200 11/24/12 0600  BP: 94/61 118/60 98/49 92/48   Pulse: 87 93 85 80  Temp: 98.7 F (37.1 C) 97.8 F (36.6 C) 98.1 F (36.7 C) 97.4 F (36.3 C)  TempSrc: Oral     Resp: 20 18 18 18   Height: 5\' 8"  (1.727 m)     Weight: 94.8 kg (208 lb 15.9 oz)     SpO2: 95% 96% 97% 98%    CBC  Recent Labs  11/23/12 1401  WBC 6.6  HGB 10.5*  HCT 32.3*  PLT 341   BMET  Recent Labs  11/23/12 1401  NA 142  K 3.6  CL 103  CO2 26  GLUCOSE 78  BUN 4*  CREATININE 0.58  CALCIUM 9.2    Patient much more comfortable than admission. Current treatment regimen has reduced pain and discomfort, and allow her to be more active, less restless, and ambulating in the halls much more successfully. Currently receiving OxyIR when necessary, Valium 5 mg by mouth every 6 hours, Toradol 30 mg IV every 6 hours, and is about to complete a 24-hour course of Tylenol 1 g IV every 6 hours. She has not received morphine IM except shortly after admission to achieve some immediate pain control.  Encouraged patient to increase her ambulation in the halls. She does use a rolling walker and her husband accompanies her in most circumstances.  Plan: Making progress in pain management. We'll continue to make adjustments as needed. Monitor pain management as Tylenol finishes, and eventually when Toradol finishes after a 48 hour course.  Hewitt Shorts, MD 11/24/2012, 10:24 AM

## 2012-11-25 MED ORDER — NABUMETONE 500 MG PO TABS
500.0000 mg | ORAL_TABLET | Freq: Two times a day (BID) | ORAL | Status: DC
  Filled 2012-11-25 (×2): qty 1

## 2012-11-25 MED ORDER — OXYCODONE-ACETAMINOPHEN 5-325 MG PO TABS: 1.0000 | ORAL_TABLET | ORAL | Status: DC | PRN

## 2012-11-25 MED ORDER — OXYCODONE-ACETAMINOPHEN 5-325 MG PO TABS
1.0000 | ORAL_TABLET | ORAL | Status: DC | PRN
  Administered 2012-11-25: 2 via ORAL
  Filled 2012-11-25: qty 2

## 2012-11-25 MED ORDER — NABUMETONE 500 MG PO TABS: 500.0000 mg | ORAL_TABLET | Freq: Two times a day (BID) | ORAL | Status: DC

## 2012-11-25 MED ORDER — DIAZEPAM 5 MG PO TABS
5.0000 mg | ORAL_TABLET | Freq: Four times a day (QID) | ORAL | Status: DC | PRN
  Administered 2012-11-25: 5 mg via ORAL

## 2012-11-25 MED ORDER — DIAZEPAM 5 MG PO TABS: 5.0000 mg | ORAL_TABLET | Freq: Four times a day (QID) | ORAL | Status: DC | PRN

## 2012-11-25 NOTE — Discharge Summary (Signed)
Physician Discharge Summary  Patient ID: Susan Sullivan MRN: [id number] DOB/AGE: [birth year] 37 y.o.  Admit date: 11/23/2012 Discharge date: 11/25/2012  Admission Diagnoses:  Disabling postoperative pain  Discharge Diagnoses:  Disabling postoperative pain  Discharged Condition: good  Hospital Course:  Patient was admitted in transfer from [facility] for management of disabling postoperative pain. We initiated Tylenol IV and Toradol IV, morphine IM when necessary (which she only received one dose at the time of admission), OxyIR 2 tablets every 4 hours when necessary pain, Valium 5 mg every 6 hours, and Flexeril when necessary. At this point her pain is improved although not fully resolved and she is asking to be discharged home. Her current treatment regimen is OxyIR 2 tablets every 4 hours and Valium 5 mg every 6 hours. She's been prescribed Percocet, Valium, and Relafen for discharge. She's been given instructions regarding wound care and activities. She is to return for followup with Dr. Delma Officer when he returns from being out of town.  Discharge Exam: Blood pressure 106/57, pulse 95, temperature 98.2 F (36.8 C), temperature source Oral, resp. rate 18, height 5\' 8"  (1.727 m), weight 94.8 kg (208 lb 15.9 oz), last menstrual period 11/07/2012, SpO2 100.00%.  Disposition: Home     Medication List         acyclovir 400 MG tablet  Commonly known as:  ZOVIRAX  Take 400 mg by mouth daily as needed. Cold sores     albuterol 108 (90 BASE) MCG/ACT inhaler  Commonly known as:  PROAIR HFA  Inhale 2 puffs into the lungs every 6 (six) hours as needed for wheezing.     ALPRAZolam 0.25 MG tablet  Commonly known as:  XANAX  Take 1 tablet (0.25 mg total) by mouth 2 (two) times daily as needed for sleep.     aspirin 81 MG tablet  Take 81 mg by mouth daily.     cetirizine 10 MG tablet  Commonly known as:  ZYRTEC  Take 1 tablet (10 mg total) by mouth daily.     cholecalciferol 1000 UNITS  tablet  Commonly known as:  VITAMIN D  Take 1,000 Units by mouth daily.     cyclobenzaprine 10 MG tablet  Commonly known as:  FLEXERIL  Take 1 tablet (10 mg total) by mouth 3 (three) times daily as needed for muscle spasms.     diazepam 5 MG tablet  Commonly known as:  VALIUM  1 or 2 tablets at bedtime as needed for spasm     diazepam 5 MG tablet  Commonly known as:  VALIUM  Take 1 tablet (5 mg total) by mouth every 6 (six) hours as needed (spasms).     diphenhydrAMINE 25 MG tablet  Commonly known as:  BENADRYL  Take 25 mg by mouth every 6 (six) hours as needed for itching or allergies.     L-Methylfolate 15 MG Tabs  Take 1 tablet (15 mg total) by mouth daily.     metoprolol tartrate 25 MG tablet  Commonly known as:  LOPRESSOR  Take 12.5 mg by mouth 2 (two) times daily as needed. For tachycardia     montelukast 10 MG tablet  Commonly known as:  SINGULAIR  Take 1 tablet (10 mg total) by mouth at bedtime.     nabumetone 500 MG tablet  Commonly known as:  RELAFEN  Take 1 tablet (500 mg total) by mouth 2 (two) times daily.     ondansetron 4 MG disintegrating tablet  Commonly known as:  ZOFRAN ODT  Take 1 tablet (4 mg total) by mouth every 8 (eight) hours as needed for nausea.     oxyCODONE-acetaminophen 5-325 MG per tablet  Commonly known as:  PERCOCET/ROXICET  Take 1-2 tablets by mouth every 4 (four) hours as needed for pain.     ranitidine 300 MG tablet  Commonly known as:  ZANTAC  Take 1 tablet (300 mg total) by mouth at bedtime.     traMADol 50 MG tablet  Commonly known as:  ULTRAM  Take 50 mg by mouth every 6 (six) hours as needed for pain.     vitamin C 100 MG tablet  Take 100 mg by mouth daily.         Signed: Hewitt Shorts, MD 11/25/2012, 2:50 PM

## 2012-11-26 LAB — CULTURE, BLOOD (SINGLE)

## 2012-11-28 ENCOUNTER — Encounter: Payer: Self-pay | Admitting: Internal Medicine

## 2012-12-13 ENCOUNTER — Other Ambulatory Visit: Payer: Self-pay | Admitting: Internal Medicine

## 2012-12-24 ENCOUNTER — Other Ambulatory Visit (INDEPENDENT_AMBULATORY_CARE_PROVIDER_SITE_OTHER): Payer: Self-pay

## 2012-12-24 MED ORDER — ALPRAZOLAM 0.25 MG PO TABS: 0.2500 mg | ORAL_TABLET | Freq: Two times a day (BID) | ORAL | Status: DC | PRN

## 2012-12-25 NOTE — Telephone Encounter (Signed)
Refill sent.

## 2013-01-17 ENCOUNTER — Encounter: Payer: Self-pay | Admitting: *Deleted

## 2013-01-21 ENCOUNTER — Encounter: Payer: Self-pay | Admitting: Internal Medicine

## 2013-01-21 ENCOUNTER — Ambulatory Visit (INDEPENDENT_AMBULATORY_CARE_PROVIDER_SITE_OTHER): Payer: 59 | Admitting: Internal Medicine

## 2013-01-21 VITALS — BP 142/86 | HR 101 | Temp 98.3°F | Resp 14 | Ht 69.0 in | Wt 206.5 lb

## 2013-01-21 DIAGNOSIS — F411 Generalized anxiety disorder: Secondary | ICD-10-CM

## 2013-01-21 DIAGNOSIS — E876 Hypokalemia: Secondary | ICD-10-CM

## 2013-01-21 DIAGNOSIS — R000 Tachycardia, unspecified: Secondary | ICD-10-CM

## 2013-01-21 DIAGNOSIS — M48061 Spinal stenosis, lumbar region without neurogenic claudication: Secondary | ICD-10-CM

## 2013-01-21 MED ORDER — METHOCARBAMOL 500 MG PO TABS: 500.0000 mg | ORAL_TABLET | Freq: Four times a day (QID) | ORAL | Status: DC

## 2013-01-21 NOTE — Progress Notes (Signed)
Patient ID: Susan Sullivan, female   DOB: [birth year], 37 y.o.   MRN: [id number]  Patient Active Problem List   Diagnosis Date Noted  . Hypokalemia 01/23/2013  . Anxiety state, unspecified 08/06/2012  . Tachycardia 08/06/2012  . 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 Infectious mononucleosis 03/29/2012  . Colicky RUQ abdominal pain 02/25/2012  . Back pain, thoracic 02/23/2012  . Elevated blood pressure reading without diagnosis of hypertension 01/07/2012  . 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 Oral allergy syndrome 05/14/2011  . Pre-conception counseling 05/14/2011  . Lipids abnormal 05/14/2011  . 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 History of ectopic pregnancy   . Spinal stenosis of lumbar region     Subjective:  CC:   Chief Complaint  Patient presents with  . Follow-up    Low potassium and sleep issues    HPI:   Susan Porter-Mooreis a 38 y.o. female who presents Follow up on  multiple issues.  1)  recent lumbar spine surgery by Delma Officer which was done on June 25th at [facility].  Her postoperative course was complicated by severe admitting pain which resulted in an admission to [facility] on July 2 with subsequent transfer to [facility] on July 5. Patient states that while she was in house for evaluation she was placed on the allotted PCA and was inadvertently overdosed. She is tearful today because she felt that she had to run her own code because the staff at [facility] did not know what to do. She states that her daughter witnessed her gasping for air and is adamant that her family witnessed and incompetent resuscitation attempt on her. Patient was apparently awake and breathing on her own throughout the ordeal reports that she was having stridor and respiratory distress. She has tried discussing the events with the nursing supervisor who is disagreement  with her about the events that transpired..    She is currently off of all narcotics. She is having some difficulty walking due to tightness of the muscles in her legs and buttocks. She has relief with Flexeril but this makes her too drowsy to work. She is requesting an alternative muscle ask her.  3)  She is worried about recurrent hypokalemia. Her potassium dipped to 3.0 during her hospitalization. Her diarrhea has returned.   4) Has had a constant headache for the past 2 weeks,  Intermittent,  doesn't feel herself.  Not from pain ,  Just uncomortable because of the screws.  Fitful sleeping.  Husband has noted snoring increased at night all the time       Past Medical History  Diagnosis Date  . Ectopic pregnancy, tubal Sept 2012    left,  with rupture ,  and concurrent right ovarian cyst rupture  . 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 MTHFR (methylene THF reductase) deficiency and homocystinuria   . Complication of anesthesia     ketamin - difficulty waking up  . Dysrhythmia     tachycardia - sees dr. Kirke Corin  . Gestational diabetes     "only when I'm pregnant" (11/13/2012)  . Degenerative disk disease     not on medication due to attempts to get pregant  . Chronic lower back pain     Past Surgical History  Procedure Laterality Date  . Cesarean section  2002; 2010  . Dilation and curettage of uterus  2012; 2013  . Spine  surgery  2007    SI joint fusion [facility]  . Cholecystectomy  Dec 2012    Jason Dew, [facility]  . Posterior lumbar fusion  11/13/2012  . Augmentation mammaplasty Bilateral 2007  . Laparoscopy for ectopic pregnancy  2012       The following portions of the patient's history were reviewed and updated as appropriate: Allergies, current medications, and problem list.    Review of Systems:   12 Pt  review of systems was negative except those addressed in the HPI,     History   Social History  . Marital Status: Married    Spouse Name: N/A    Number of Children:  N/A  . Years of Education: N/A   Occupational History  . Not on file.   Social History Main Topics  . Smoking status: Never Smoker   . Smokeless tobacco: Never Used  . Alcohol Use: No  . Drug Use: No  . Sexual Activity: Yes   Other Topics Concern  . Not on file   Social History Narrative  . No narrative on file    Objective:  Filed Vitals:   01/21/13 1454  BP: 142/86  Pulse: 101  Temp: 98.3 F (36.8 C)  Resp: 14     General appearance: alert, cooperative and appears stated age Ears: normal TM's and external ear canals both ears Throat: lips, mucosa, and tongue normal; teeth and gums normal Neck: no adenopathy, no carotid bruit, supple, symmetrical, trachea midline and thyroid not enlarged, symmetric, no tenderness/mass/nodules Back: symmetric, no curvature. ROM normal. No CVA tenderness. Lungs: clear to auscultation bilaterally Heart: regular rate and rhythm, S1, S2 normal, no murmur, click, rub or gallop Abdomen: soft, non-tender; bowel sounds normal; no masses,  no organomegaly Pulses: 2+ and symmetric Skin: Skin color, texture, turgor normal. No rashes or lesions Lymph nodes: Cervical, supraclavicular, and axillary nodes normal.  Assessment and Plan:  Spinal stenosis of lumbar region Continue physical therapy. Muscle relaxer changed to Robaxin for less sedating side effects.  Tachycardia Attributed to deconditioning anxiety after cardiology evaluation.  Anxiety state, unspecified Persistent, secondary to multiple complex medical issues and financial stress. Continue when necessary alprazolam. Valium can be used for longer acting effects. We'll consider SSRI therapy if symptoms persist.  Hypokalemia Resolved, likely secondary to GI losses. Magnesium level was checked and also normal. Reassurance provided.  A total of 40 minutes was spent with patient more than half of which was spent in counseling, reviewing records from other prviders and coordination of  care.   Updated Medication List Outpatient Encounter Prescriptions as of 01/21/2013  Medication Sig Dispense Refill  . acyclovir (ZOVIRAX) 400 MG tablet Take 400 mg by mouth daily as needed. Cold sores      . albuterol (PROAIR HFA) 108 (90 BASE) MCG/ACT inhaler Inhale 2 puffs into the lungs every 6 (six) hours as needed for wheezing.  6.7 g  11  . ALPRAZolam (XANAX) 0.25 MG tablet Take 1 tablet (0.25 mg total) by mouth 2 (two) times daily as needed for sleep or anxiety.  30 tablet  2  . Ascorbic Acid (VITAMIN C) 100 MG tablet Take 100 mg by mouth daily.      [name] aspirin 81 MG tablet Take 81 mg by mouth daily.      . cholecalciferol (VITAMIN D) 1000 UNITS tablet Take 1,000 Units by mouth daily.      . cyclobenzaprine (FLEXERIL) 10 MG tablet Take 1 tablet (10 mg total) by mouth 3 (  three) times daily as needed for muscle spasms.  60 tablet  2  . diazepam (VALIUM) 5 MG tablet Take 1 tablet (5 mg total) by mouth every 6 (six) hours as needed (spasms).  60 tablet  0  . diphenhydrAMINE (BENADRYL) 25 MG tablet Take 25 mg by mouth every 6 (six) hours as needed for itching or allergies.      [name] L-Methylfolate 15 MG TABS Take 1 tablet (15 mg total) by mouth daily.  30 tablet  6  . metoprolol tartrate (LOPRESSOR) 25 MG tablet Take 12.5 mg by mouth 2 (two) times daily as needed. For tachycardia      . montelukast (SINGULAIR) 10 MG tablet Take 1 tablet (10 mg total) by mouth at bedtime.  90 tablet  3  . ondansetron (ZOFRAN ODT) 4 MG disintegrating tablet Take 1 tablet (4 mg total) by mouth every 8 (eight) hours as needed for nausea.  20 tablet  0  . ranitidine (ZANTAC) 300 MG tablet Take 1 tablet (300 mg total) by mouth at bedtime.  90 tablet  3  . traMADol (ULTRAM) 50 MG tablet Take 50 mg by mouth every 6 (six) hours as needed for pain.      . [DISCONTINUED] diazepam (VALIUM) 5 MG tablet 1 or 2 tablets at bedtime as needed for spasm  60 tablet  1  . cetirizine (ZYRTEC) 10 MG tablet Take 1 tablet (10 mg total) by  mouth daily.  90 tablet  3  . methocarbamol (ROBAXIN) 500 MG tablet Take 1 tablet (500 mg total) by mouth 4 (four) times daily.  90 tablet  3  . oxyCODONE-acetaminophen (PERCOCET/ROXICET) 5-325 MG per tablet Take 1-2 tablets by mouth every 4 (four) hours as needed for pain.  75 tablet  0  . [DISCONTINUED] nabumetone (RELAFEN) 500 MG tablet Take 1 tablet (500 mg total) by mouth 2 (two) times daily.  60 tablet  1   No facility-administered encounter medications on file as of 01/21/2013.     Orders Placed This Encounter  Procedures  . Basic metabolic panel  . Magnesium    No Follow-up on file.

## 2013-01-21 NOTE — Patient Instructions (Addendum)
Go to the [employer] (across from [facility]) to see if they have a protein bar or protein supplement that you can use.  It will help you lose weight if you can eat every 3 hours, even if you can't exercise yet .  If your labs are normal we will schedule a sleep study

## 2013-01-22 ENCOUNTER — Encounter: Payer: Self-pay | Admitting: Internal Medicine

## 2013-01-22 LAB — BASIC METABOLIC PANEL
BUN: 9 mg/dL (ref 6–23)
CO2: 29 mEq/L (ref 19–32)
Calcium: 9.2 mg/dL (ref 8.4–10.5)
Chloride: 105 mEq/L (ref 96–112)
Creatinine, Ser: 0.6 mg/dL (ref 0.4–1.2)
Glucose, Bld: 120 mg/dL — ABNORMAL HIGH (ref 70–99)

## 2013-01-23 ENCOUNTER — Encounter: Payer: Self-pay | Admitting: Internal Medicine

## 2013-01-23 DIAGNOSIS — E876 Hypokalemia: Secondary | ICD-10-CM | POA: Insufficient documentation

## 2013-01-23 NOTE — Assessment & Plan Note (Signed)
Attributed to deconditioning anxiety after cardiology evaluation.

## 2013-01-23 NOTE — Assessment & Plan Note (Signed)
Resolved, likely secondary to GI losses. Magnesium level was checked and also normal. Reassurance provided.

## 2013-01-23 NOTE — Assessment & Plan Note (Signed)
Persistent, secondary to multiple complex medical issues and financial stress. Continue when necessary alprazolam. Valium can be used for longer acting effects. We'll consider SSRI therapy if symptoms persist.

## 2013-01-23 NOTE — Assessment & Plan Note (Signed)
Continue physical therapy. Muscle relaxer changed to Robaxin for less sedating side effects.

## 2013-01-30 ENCOUNTER — Other Ambulatory Visit: Payer: Self-pay | Admitting: *Deleted

## 2013-01-31 ENCOUNTER — Encounter: Payer: Self-pay | Admitting: Internal Medicine

## 2013-01-31 DIAGNOSIS — R400 Somnolence: Secondary | ICD-10-CM

## 2013-01-31 DIAGNOSIS — E669 Obesity, unspecified: Secondary | ICD-10-CM

## 2013-01-31 DIAGNOSIS — G4700 Insomnia, unspecified: Secondary | ICD-10-CM

## 2013-01-31 DIAGNOSIS — R0683 Snoring: Secondary | ICD-10-CM

## 2013-01-31 MED ORDER — TRAMADOL HCL 50 MG PO TABS: 50.0000 mg | ORAL_TABLET | Freq: Four times a day (QID) | ORAL | Status: DC | PRN

## 2013-03-07 ENCOUNTER — Encounter: Payer: Self-pay | Admitting: Internal Medicine

## 2013-03-10 ENCOUNTER — Telehealth: Payer: Self-pay | Admitting: Internal Medicine

## 2013-03-10 NOTE — Telephone Encounter (Signed)
Placed in red folder  

## 2013-03-10 NOTE — Telephone Encounter (Signed)
Pt dropped off form to be filled out. I put it in Dr. Melina Schools inbox up front. She ask if we can fax the form over to [employer] at [facility] [phone] and to let her know it has been done by calling her at [phone] by MyChart message.

## 2013-03-11 NOTE — Telephone Encounter (Signed)
Form completed,  Please fax as requested  No charge.

## 2013-03-11 NOTE — Telephone Encounter (Signed)
My chart message sent to inform patient of faxing of requested form.

## 2013-03-27 ENCOUNTER — Other Ambulatory Visit: Payer: Self-pay

## 2013-04-19 IMAGING — US TRANSABDOMINAL ULTRASOUND OF PELVIS
1 series · 14 of 25 positions shown · non-contrast
Comparison: none

REASON FOR EXAM: RLQ pain; hx of hemorrhagic cysts
COMMENTS:

PROCEDURE:     MARIBO - MARIBO PELVIS NON-OB W/TRANSVAGINAL  - March 19, 2012  [DATE]
RESULT:

[Series 1: transabdominal ultrasound of pelvis · 0.28mm/px · 14 of 73 slices shown]
[im 1/73]
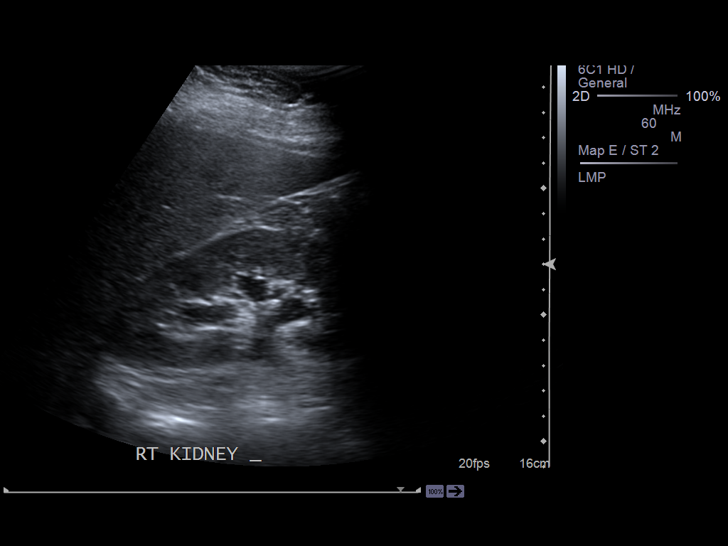
[im 7/73]
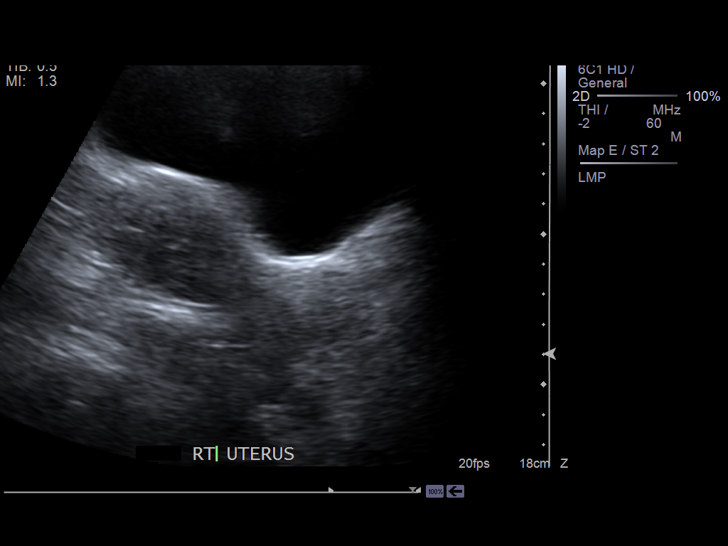
[im 13/73]
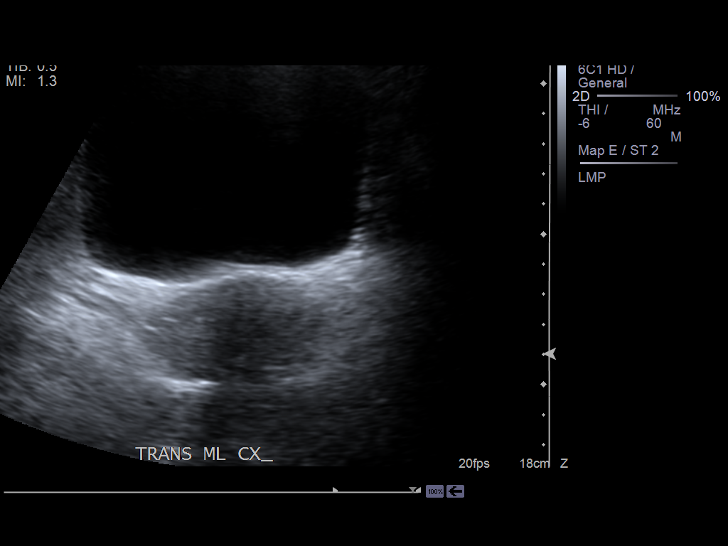
[im 19/73]
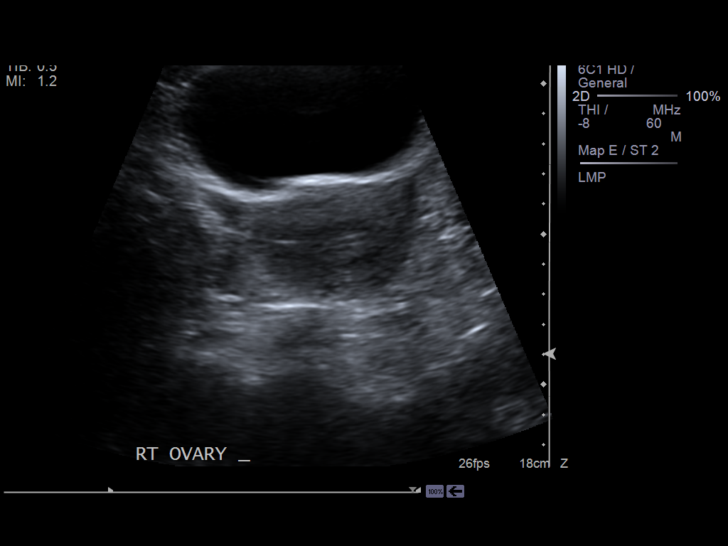
[im 25/73]
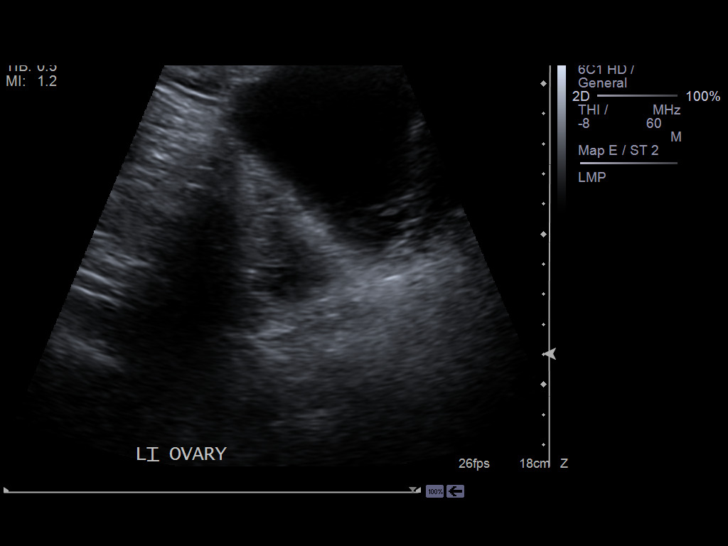
[im 28/73]
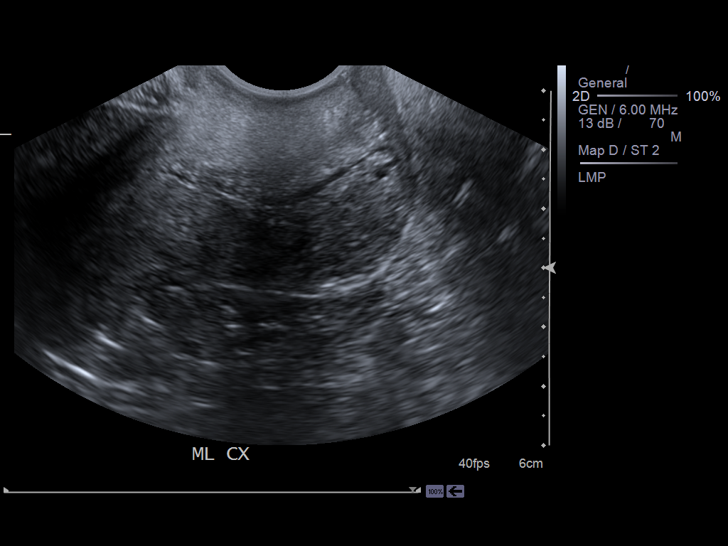
[im 34/73]
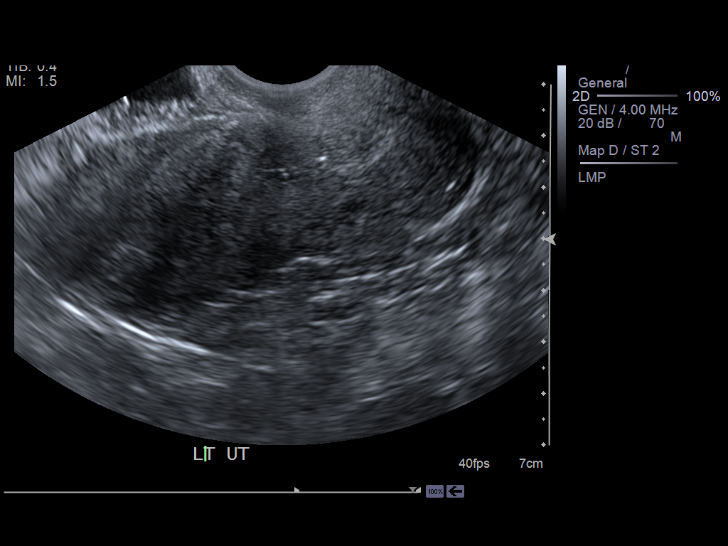
[im 40/73]
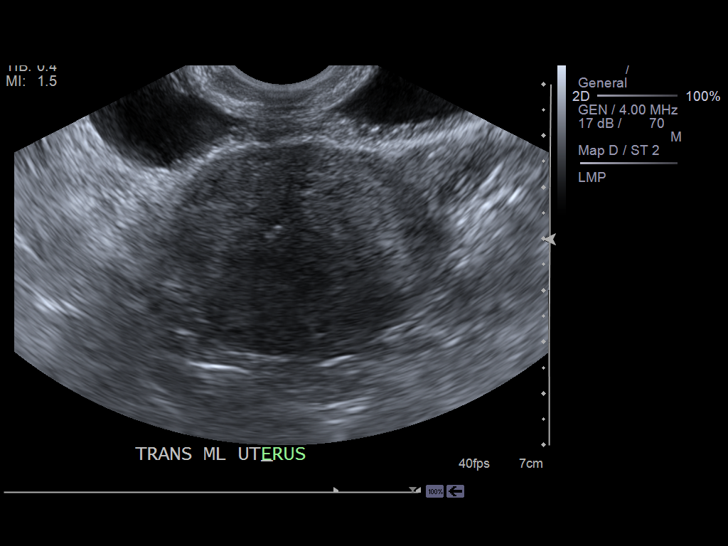
[im 46/73]
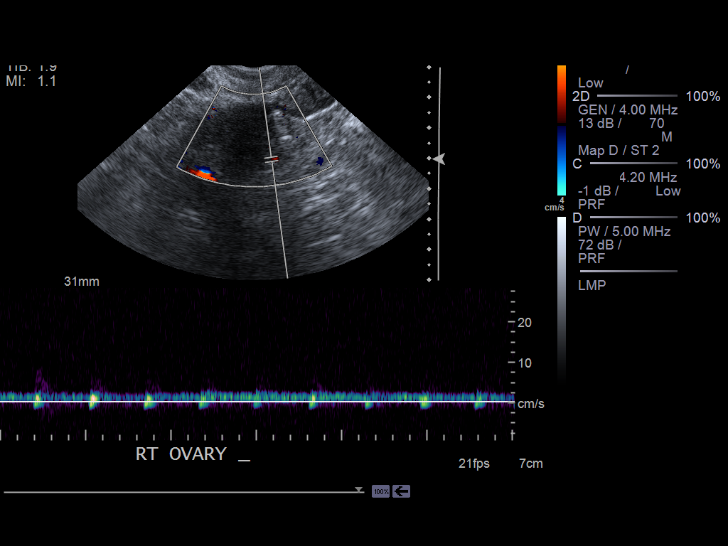
[im 49/73]
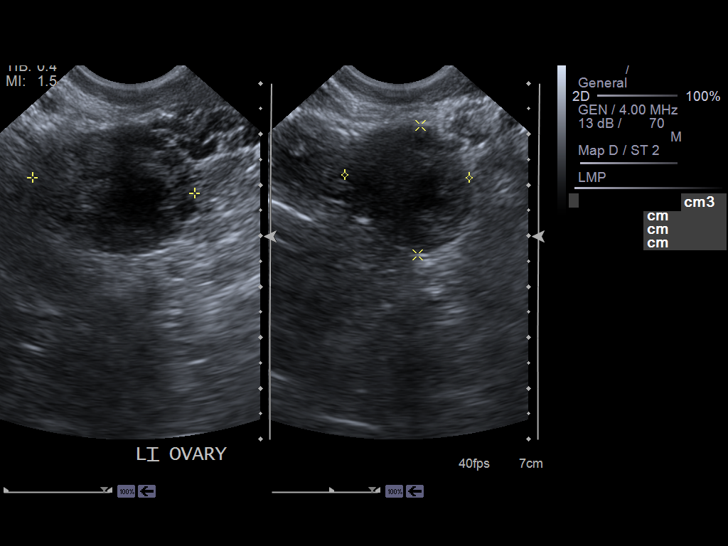
[im 55/73]
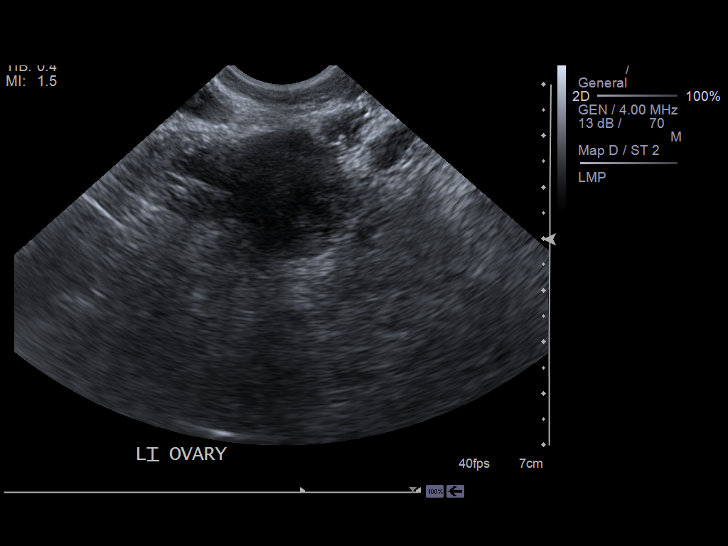
[im 61/73]
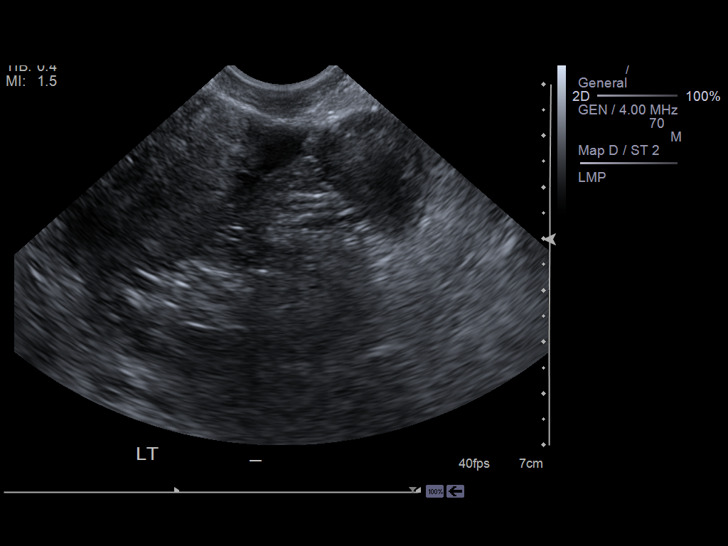
[im 67/73]
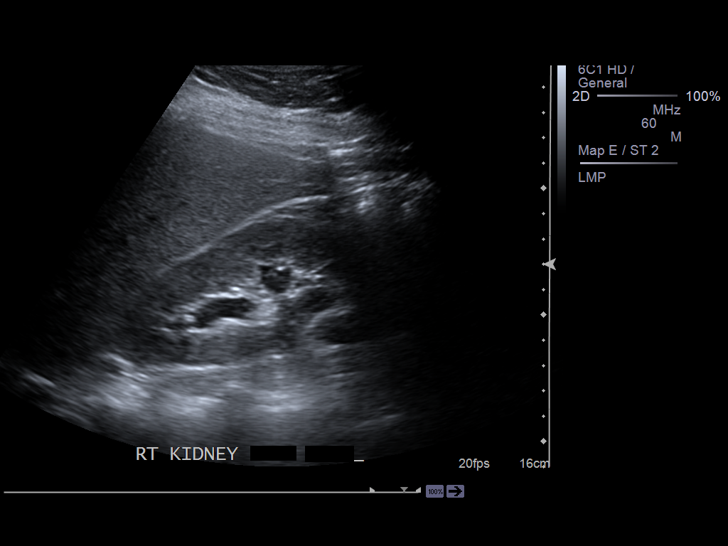
[im 73/73]
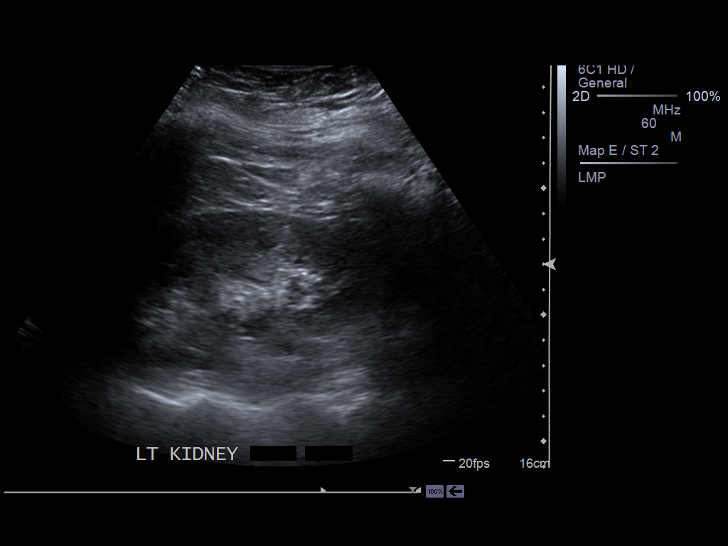

[14 of 25 positions shown; findings below may reference images not displayed]

FINDINGS: The uterus measures 9.66 x 5.53 x 4.09 cm and demonstrates a
homogeneous echotexture. Endometrial thickness is 9 mm. The right ovary
measures 2.17 x 2.46 x 2.73 cm and the left 3.2 x 2.54 x 2.45 cm. The
ovaries demonstrate a homogeneous echotexture. Color filling of vessels is
identified within the ovaries as well as arterial and venous waveforms.

There is no evidence of fluid within the pelvis.

Evaluation of the kidneys demonstrates mild hydronephrosis involving the
right kidney which has developed in the interim when compared to a previous
study dated 03/08/2012. The right kidney is otherwise unremarkable. The left
kidney is unremarkable.
IMPRESSION: 1.  Mild hydronephrosis involving the right kidney which persists post-void.
Clinical correlation is recommended.
2.  No further sonographic abnormalities.

## 2013-05-19 ENCOUNTER — Ambulatory Visit (INDEPENDENT_AMBULATORY_CARE_PROVIDER_SITE_OTHER): Payer: 59 | Admitting: Adult Health

## 2013-05-19 ENCOUNTER — Encounter: Payer: Self-pay | Admitting: Adult Health

## 2013-05-19 VITALS — BP 110/70 | HR 85 | Temp 98.0°F | Resp 12 | Ht 69.0 in | Wt 198.5 lb

## 2013-05-19 DIAGNOSIS — R109 Unspecified abdominal pain: Secondary | ICD-10-CM

## 2013-05-19 LAB — BASIC METABOLIC PANEL
BUN: 8 mg/dL (ref 6–23)
Chloride: 105 mEq/L (ref 96–112)
GFR: 103.31 mL/min (ref >60.00)
Glucose, Bld: 129 mg/dL — ABNORMAL HIGH (ref 70–99)
Potassium: 3.8 mEq/L (ref 3.5–5.1)
Sodium: 143 mEq/L (ref 135–145)

## 2013-05-19 LAB — CBC WITH DIFFERENTIAL/PLATELET
Basophils Absolute: 0.1 10*3/uL (ref 0.0–0.1)
Eosinophils Absolute: 0.1 10*3/uL (ref 0.0–0.7)
Eosinophils Relative: 2.4 % (ref 0.0–5.0)
HCT: 36.7 % (ref 36.0–46.0)
Lymphs Abs: 1.8 10*3/uL (ref 0.7–4.0)
MCHC: 33.3 g/dL (ref 30.0–36.0)
MCV: 88.1 fl (ref 78.0–100.0)
Monocytes Absolute: 0.3 10*3/uL (ref 0.1–1.0)
Neutrophils Relative %: 62.8 % (ref 43.0–77.0)
Platelets: 289 10*3/uL (ref 150.0–400.0)
RDW: 14.1 % (ref 11.5–14.6)
WBC: 6.2 10*3/uL (ref 4.5–10.5)

## 2013-05-19 LAB — HEPATIC FUNCTION PANEL
ALT: 18 U/L (ref 0–35)
Bilirubin, Direct: 0 mg/dL (ref 0.0–0.3)
Total Bilirubin: 0.4 mg/dL (ref 0.3–1.2)

## 2013-05-19 NOTE — Assessment & Plan Note (Addendum)
Chronic - ongoing "off and on" since last year. Diagnosed with multiple food allergies. Check cbc, bmet, hepatic panel. She reports having multiple CT during back surgery so would like to hold off on any further CT if able. Asked patient to keep a food diary and record specific symptoms that may occur. May need follow up with GI. RTC for follow up with PCP in 2 weeks.

## 2013-05-19 NOTE — Progress Notes (Signed)
Pre visit review using our clinic review tool, if applicable. No additional management support is needed unless otherwise documented below in the visit note. 

## 2013-05-19 NOTE — Progress Notes (Signed)
Subjective:    Patient ID: Susan Sullivan, female    DOB: [birth year], 37 y.o.   MRN: [id number]  HPI Patient is a pleasant 37 year old female who presents to clinic with upper abdominal pain. She reports the pain started last year. She is status post endoscopy, colonoscopy. She has been seen by [facility] immunology and diagnosed with multiple food allergies including pork and beef. She reports that she has not consumed pork or beef in approximately one year with improvement in vomiting. However, she has other food allergies which make it very difficult for her to fully control her symptoms. She reports diarrhea occurring after meals. She reports slight cramping just prior to experiencing diarrhea. She reports improvement following the episodes of diarrhea. Her pain is generalized in the upper quadrants. She reports a 20 pound weight loss per her scales at work; however, her weight has been stable per our office records. She denies blood in the stool, fever or chills. Pt has not kept a food diary to identify other potential food triggers.  Current Outpatient Prescriptions on File Prior to Visit  Medication Sig Dispense Refill  . acyclovir (ZOVIRAX) 400 MG tablet Take 400 mg by mouth daily as needed. Cold sores      . albuterol (PROAIR HFA) 108 (90 BASE) MCG/ACT inhaler Inhale 2 puffs into the lungs every 6 (six) hours as needed for wheezing.  6.7 g  11  . ALPRAZolam (XANAX) 0.25 MG tablet Take 1 tablet (0.25 mg total) by mouth 2 (two) times daily as needed for sleep or anxiety.  30 tablet  2  . aspirin 81 MG tablet Take 81 mg by mouth daily.      . cyclobenzaprine (FLEXERIL) 10 MG tablet Take 1 tablet (10 mg total) by mouth 3 (three) times daily as needed for muscle spasms.  60 tablet  2  . diazepam (VALIUM) 5 MG tablet Take 1 tablet (5 mg total) by mouth every 6 (six) hours as needed (spasms).  60 tablet  0  . diphenhydrAMINE (BENADRYL) 25 MG tablet Take 25 mg by mouth every 6 (six) hours as needed  for itching or allergies.      [name] L-Methylfolate 15 MG TABS Take 1 tablet (15 mg total) by mouth daily.  30 tablet  6  . montelukast (SINGULAIR) 10 MG tablet Take 1 tablet (10 mg total) by mouth at bedtime.  90 tablet  3  . ondansetron (ZOFRAN ODT) 4 MG disintegrating tablet Take 1 tablet (4 mg total) by mouth every 8 (eight) hours as needed for nausea.  20 tablet  0  . ranitidine (ZANTAC) 300 MG tablet Take 1 tablet (300 mg total) by mouth at bedtime.  90 tablet  3  . traMADol (ULTRAM) 50 MG tablet Take 1 tablet (50 mg total) by mouth every 6 (six) hours as needed for pain.  30 tablet  4  . metoprolol tartrate (LOPRESSOR) 25 MG tablet Take 12.5 mg by mouth 2 (two) times daily as needed. For tachycardia       No current facility-administered medications on file prior to visit.    Review of Systems  Constitutional: Positive for unexpected weight change. Negative for fever, chills and appetite change.  Gastrointestinal: Positive for abdominal pain and diarrhea. Negative for nausea, vomiting, constipation, blood in stool, abdominal distention and anal bleeding.  Genitourinary: Negative for pelvic pain.  Neurological: Negative.   Psychiatric/Behavioral: Negative.        Objective:   Physical Exam  Constitutional: She is oriented  to person, place, and time. She appears well-developed and well-nourished. No distress.  Weight stable per office records  Cardiovascular: Normal rate and regular rhythm.   Pulmonary/Chest: Effort normal. No respiratory distress.  Abdominal: Soft. Bowel sounds are normal. She exhibits no distension and no mass. There is tenderness. There is guarding. There is no rebound.  Neurological: She is alert and oriented to person, place, and time.  Psychiatric: She has a normal mood and affect. Her behavior is normal. Judgment and thought content normal.          Assessment & Plan:

## 2013-05-20 ENCOUNTER — Encounter: Payer: Self-pay | Admitting: *Deleted

## 2013-05-30 ENCOUNTER — Encounter: Payer: Self-pay | Admitting: *Deleted

## 2013-06-02 ENCOUNTER — Encounter: Payer: Self-pay | Admitting: Internal Medicine

## 2013-06-02 ENCOUNTER — Ambulatory Visit (INDEPENDENT_AMBULATORY_CARE_PROVIDER_SITE_OTHER): Payer: 59 | Admitting: Internal Medicine

## 2013-06-02 VITALS — BP 106/72 | HR 84 | Temp 98.3°F | Resp 16 | Wt 199.0 lb

## 2013-06-02 DIAGNOSIS — K589 Irritable bowel syndrome without diarrhea: Secondary | ICD-10-CM

## 2013-06-02 MED ORDER — DICYCLOMINE HCL 20 MG PO TABS: 20.0000 mg | ORAL_TABLET | Freq: Three times a day (TID) | ORAL | Status: DC

## 2013-06-02 NOTE — Patient Instructions (Addendum)
I think your symptoms may be coming form "irritable bowel,"  Not from more allergies1  Let's try taking an antispasmodic 30 minutes prior to meals called dicylcomine .  Start with 1/2 tablet,  Or 10 mg  And increase to 20 mg if needed,  up to 4 times daily   Irritable Bowel Syndrome Irritable Bowel Syndrome (IBS) is caused by a disturbance of normal bowel function. Other terms used are spastic colon, mucous colitis, and irritable colon. It does not require surgery, nor does it lead to cancer. There is no cure for IBS. But with proper diet, stress reduction, and medication, you will find that your problems (symptoms) will gradually disappear or improve. IBS is a common digestive disorder. It usually appears in late adolescence or early adulthood. Women develop it twice as often as men. CAUSES  After food has been digested and absorbed in the small intestine, waste material is moved into the colon (large intestine). In the colon, water and salts are absorbed from the undigested products coming from the small intestine. The remaining residue, or fecal material, is held for elimination. Under normal circumstances, gentle, rhythmic contractions on the bowel walls push the fecal material along the colon towards the rectum. In IBS, however, these contractions are irregular and poorly coordinated. The fecal material is either retained too long, resulting in constipation, or expelled too soon, producing diarrhea. SYMPTOMS  The most common symptom of IBS is pain. It is typically in the lower left side of the belly (abdomen). But it may occur anywhere in the abdomen. It can be felt as heartburn, backache, or even as a dull pain in the arms or shoulders. The pain comes from excessive bowel-muscle spasms and from the buildup of gas and fecal material in the colon. This pain:  Can range from sharp belly (abdominal) cramps to a dull, continuous ache.  Usually worsens soon after eating.  Is typically relieved by having  a bowel movement or passing gas. Abdominal pain is usually accompanied by constipation. But it may also produce diarrhea. The diarrhea typically occurs right after a meal or upon arising in the morning. The stools are typically soft and watery. They are often flecked with secretions (mucus). Other symptoms of IBS include:  Bloating.  Loss of appetite.  Heartburn.  Feeling sick to your stomach (nausea).  Belching  Vomiting  Gas. IBS may also cause a number of symptoms that are unrelated to the digestive system:  Fatigue.  Headaches.  Anxiety  Shortness of breath  Difficulty in concentrating.  Dizziness. These symptoms tend to come and go. DIAGNOSIS  The symptoms of IBS closely mimic the symptoms of other, more serious digestive disorders. So your caregiver may wish to perform a variety of additional tests to exclude these disorders. He/she wants to be certain of learning what is wrong (diagnosis). The nature and purpose of each test will be explained to you. TREATMENT A number of medications are available to help correct bowel function and/or relieve bowel spasms and abdominal pain. Among the drugs available are:  Mild, non-irritating laxatives for severe constipation and to help restore normal bowel habits.  Specific anti-diarrheal medications to treat severe or prolonged diarrhea.  Anti-spasmodic agents to relieve intestinal cramps.  Your caregiver may also decide to treat you with a mild tranquilizer or sedative during unusually stressful periods in your life. The important thing to remember is that if any drug is prescribed for you, make sure that you take it exactly as directed. Make sure  that your caregiver knows how well it worked for you. HOME CARE INSTRUCTIONS   Avoid foods that are high in fat or oils. Some examples YNW:GNFAO cream, butter, frankfurters, sausage, and other fatty meats.  Avoid foods that have a laxative effect, such as fruit, fruit juice, and  dairy products.  Cut out carbonated drinks, chewing gum, and "gassy" foods, such as beans and cabbage. This may help relieve bloating and belching.  Bran taken with plenty of liquids may help relieve constipation.  Keep track of what foods seem to trigger your symptoms.  Avoid emotionally charged situations or circumstances that produce anxiety.  Start or continue exercising.  Get plenty of rest and sleep. MAKE SURE YOU:   Understand these instructions.  Will watch your condition.  Will get help right away if you are not doing well or get worse. Document Released: 05/08/2005 Document Revised: 07/31/2011 Document Reviewed: 12/27/2007 [facility] Patient Information 2014 [place].   Diet and Irritable Bowel Syndrome  No cure has been found for irritable bowel syndrome (IBS). Many options are available to treat the symptoms. Your caregiver will give you the best treatments available for your symptoms. He or she will also encourage you to manage stress and to make changes to your diet. You need to work with your caregiver and Registered Dietician to find the best combination of medicine, diet, counseling, and support to control your symptoms. The following are some diet suggestions. FOODS THAT MAKE IBS WORSE  Fatty foods, such as [place] fries.  Milk products, such as cheese or ice cream.  Chocolate.  Alcohol.  Caffeine (found in coffee and some sodas).  Carbonated drinks, such as soda. If certain foods cause symptoms, you should eat less of them or stop eating them. FOOD JOURNAL   Keep a journal of the foods that seem to cause distress. Write down:  What you are eating during the day and when.  What problems you are having after eating.  When the symptoms occur in relation to your meals.  What foods always make you feel badly.  Take your notes with you to your caregiver to see if you should stop eating certain foods.   FOODS THAT MAKE IBS BETTER Fiber reduces  IBS symptoms, especially constipation, because it makes stools soft, bulky, and easier to pass. Fiber is found in bran, bread, cereal, beans, fruit, and vegetables. Examples of foods with fiber include:  Apples.  Peaches.  Pears.  Berries.  Figs.  Broccoli, raw.  Cabbage.  Carrots.  Raw peas.  Kidney beans.  Lima beans.  Whole-grain bread.  Whole-grain cereal. Add foods with fiber to your diet a little at a time. This will let your body get used to them. Too much fiber at once might cause gas and swelling of your abdomen. This can trigger symptoms in a person with IBS. Caregivers usually recommend a diet with enough fiber to produce soft, painless bowel movements. High fiber diets may cause gas and bloating. However, these symptoms often go away within a few weeks, as your body adjusts. In many cases, dietary fiber may lessen IBS symptoms, particularly constipation. However, it may not help pain or diarrhea. High fiber diets keep the colon mildly enlarged (distended) with the added fiber. This may help prevent spasms in the colon. Some forms of fiber also keep water in the stool, thereby preventing hard stools that are difficult to pass.  Besides telling you to eat more foods with fiber, your caregiver may also tell you to  get more fiber by taking a fiber pill or drinking water mixed with a special high fiber powder. An example of this is a natural fiber laxative containing psyllium seed.  TIPS  Large meals can cause cramping and diarrhea in people with IBS. If this happens to you, try eating 4 or 5 small meals a day, or try eating less at each of your usual 3 meals. It may also help if your meals are low in fat and high in carbohydrates. Examples of carbohydrates are pasta, rice, whole-grain breads and cereals, fruits, and vegetables.  If dairy products cause your symptoms to flare up, you can try eating less of those foods. You might be able to handle yogurt better than other  dairy products, because it contains bacteria that helps with digestion. Dairy products are an important source of calcium and other nutrients. If you need to avoid dairy products, be sure to talk with a Registered Dietitian about getting these nutrients through other food sources.  Drink enough water and fluids to keep your urine clear or pale yellow. This is important, especially if you have diarrhea. FOR MORE INFORMATION  International Foundation for Functional Gastrointestinal Disorders: www.iffgd.org  National Digestive Diseases Information Clearinghouse: digestive.AmenCredit.is Document Released: 07/29/2003 Document Revised: 07/31/2011 Document Reviewed: 04/15/2007 [facility] Patient Information 2014 [place], [place].

## 2013-06-02 NOTE — Progress Notes (Signed)
Patient ID: Susan Sullivan, female   DOB: 23-Mar-1976, 38 y.o.   MRN: [id number]  Patient Active Problem List   Diagnosis Date Noted  . Irritable bowel syndrome 06/03/2013  . Abdominal pain, other specified site 05/19/2013  . Hypokalemia 01/23/2013  . Anxiety state, unspecified 08/06/2012  . Tachycardia 08/06/2012  . 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 Infectious mononucleosis 03/29/2012  . Colicky RUQ abdominal pain 02/25/2012  . Back pain, thoracic 02/23/2012  . Elevated blood pressure reading without diagnosis of hypertension 01/07/2012  . 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 Oral allergy syndrome 05/14/2011  . Pre-conception counseling 05/14/2011  . Lipids abnormal 05/14/2011  . 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 History of ectopic pregnancy   . Spinal stenosis of lumbar region     Subjective:  CC:   Chief Complaint  Patient presents with  . Acute Visit    chronic dairrhea    HPI:   Susan Porter-Mooreis a 38 y.o. female who presents 2 month  History of post prandial loose stools and right sided pain aggravated by eating and accompanied by post prandial loose stools.  Has cut out beef and pork due to alpha gal allergy [facility] last year.  All symptoms are triggered by eating  Certain Proteins.  Rice and veggies does not bother her, but she also has had days where she has loose stools even if doesn't eat chicken.  She Alternates between constipation and diarrhea.  She denies the presence of blood or mucus in her stools. She has had no significant weight loss and remains overweight. She is status post cholecystectomy .  Past Medical History  Diagnosis Date  . Ectopic pregnancy, tubal Sept 2012    left,  with rupture ,  and concurrent right ovarian cyst rupture  . 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 MTHFR (methylene THF reductase) deficiency and  homocystinuria   . Complication of anesthesia     ketamin - difficulty waking up  . Dysrhythmia     tachycardia - sees dr. Kirke Corinarida  . Gestational diabetes     "only when I'm pregnant" (11/13/2012)  . Degenerative disk disease     not on medication due to attempts to get pregant  . Chronic lower back pain     Past Surgical History  Procedure Laterality Date  . Cesarean section  2002; 2010  . Dilation and curettage of uterus  2012; 2013  . [facility]  Dec 2012    [facility]  . Posterior lumbar fusion  11/13/2012  . Augmentation mammaplasty Bilateral 2007  . Laparoscopy for ectopic pregnancy  2012  . Spine surgery  2007    SI joint fusion [facility]  . Spine surgery  June 2014    Delma OfficerJeff Jenkins  L4-L5       The following portions of the patient's history were reviewed and updated as appropriate: Allergies, current medications, and problem list.    Review of Systems:   12 Pt  review of systems was negative except those addressed in the HPI,     History   Social History  . Marital Status: Married    Spouse Name: N/A    Number of Children: N/A  . Years of Education: N/A   Occupational History  . Not on file.   Social History Main Topics  . Smoking status: Never Smoker   . Smokeless tobacco:  Never Used  . Alcohol Use: No  . Drug Use: No  . Sexual Activity: Yes   Other Topics Concern  . Not on file   Social History Narrative  . No narrative on file    Objective:  Filed Vitals:   06/02/13 1123  BP: 106/72  Pulse: 84  Temp: 98.3 F (36.8 C)  Resp: 16     General appearance: alert, cooperative and appears stated age Ears: normal TM's and external ear canals both ears Throat: lips, mucosa, and tongue normal; teeth and gums normal Neck: no adenopathy, no carotid bruit, supple, symmetrical, trachea midline and thyroid not enlarged, symmetric, no tenderness/mass/nodules Back: symmetric, no curvature. ROM normal. No CVA  tenderness. Lungs: clear to auscultation bilaterally Heart: regular rate and rhythm, S1, S2 normal, no murmur, click, rub or gallop Abdomen: soft, non-tender; bowel sounds normal; no masses,  no organomegaly Pulses: 2+ and symmetric Skin: Skin color, texture, turgor normal. No rashes or lesions Lymph nodes: Cervical, supraclavicular, and axillary nodes normal.  Assessment and Plan:  Irritable bowel syndrome Suggested by her history of recurrent postprandial diarrhea alternating with constipation. She has had screening colonoscopy and EGD within the last year. She has multiple syndrome was and anxiety relating to her other diagnoses which maybe contributing. Discussed a trial of dicyclomine to use prior to meals. If the medication does not alleviate her symptoms we will continue further workup with referral to GI.  A total of 30 minutes was spent with patient more than half of which was spent in counseling, reviewing records from other prviders and coordination of care.  Updated Medication List Outpatient Encounter Prescriptions as of 06/02/2013  Medication Sig  . acyclovir (ZOVIRAX) 400 MG tablet Take 400 mg by mouth daily as needed. Cold sores  . albuterol (PROAIR HFA) 108 (90 BASE) MCG/ACT inhaler Inhale 2 puffs into the lungs every 6 (six) hours as needed for wheezing.  [name] ALPRAZolam (XANAX) 0.25 MG tablet Take 1 tablet (0.25 mg total) by mouth 2 (two) times daily as needed for sleep or anxiety.  [name] amitriptyline (ELAVIL) 10 MG tablet Take 10 mg by mouth at bedtime.  [name] aspirin 81 MG tablet Take 81 mg by mouth daily.  . cetirizine (ZYRTEC) 10 MG tablet Take 10 mg by mouth daily.  . cyclobenzaprine (FLEXERIL) 10 MG tablet Take 1 tablet (10 mg total) by mouth 3 (three) times daily as needed for muscle spasms.  . diazepam (VALIUM) 5 MG tablet Take 1 tablet (5 mg total) by mouth every 6 (six) hours as needed (spasms).  . diphenhydrAMINE (BENADRYL) 25 MG tablet Take 25 mg by mouth every 6 (six)  hours as needed for itching or allergies.  [name] L-Methylfolate 15 MG TABS Take 1 tablet (15 mg total) by mouth daily.  . methocarbamol (ROBAXIN) 500 MG tablet Take 500 mg by mouth every 6 (six) hours as needed.  . metoprolol tartrate (LOPRESSOR) 25 MG tablet Take 12.5 mg by mouth 2 (two) times daily as needed. For tachycardia  . montelukast (SINGULAIR) 10 MG tablet Take 1 tablet (10 mg total) by mouth at bedtime.  . ondansetron (ZOFRAN ODT) 4 MG disintegrating tablet Take 1 tablet (4 mg total) by mouth every 8 (eight) hours as needed for nausea.  . ranitidine (ZANTAC) 300 MG tablet Take 1 tablet (300 mg total) by mouth at bedtime.  . traMADol (ULTRAM) 50 MG tablet Take 1 tablet (50 mg total) by mouth every 6 (six) hours as needed for pain.  [name] dicyclomine (  BENTYL) 20 MG tablet Take 1 tablet (20 mg total) by mouth 4 (four) times daily -  before meals and at bedtime.     No orders of the defined types were placed in this encounter.    No Follow-up on file.

## 2013-06-03 DIAGNOSIS — K589 Irritable bowel syndrome without diarrhea: Secondary | ICD-10-CM | POA: Insufficient documentation

## 2013-06-03 NOTE — Assessment & Plan Note (Signed)
Suggested by her history of recurrent postprandial diarrhea alternating with constipation. She has had screening colonoscopy and EGD within the last year. She has multiple syndrome was and anxiety relating to her other diagnoses which maybe contributing. Discussed a trial of dicyclomine to use prior to meals. If the medication does not alleviate her symptoms we will continue further workup with referral to GI.

## 2013-06-05 ENCOUNTER — Encounter: Payer: Self-pay | Admitting: Internal Medicine

## 2013-06-06 ENCOUNTER — Other Ambulatory Visit: Payer: Self-pay | Admitting: Internal Medicine

## 2013-06-06 NOTE — Telephone Encounter (Signed)
Okay to refill? 

## 2013-06-06 NOTE — Telephone Encounter (Signed)
Refill Request:  Epipen 2-pak 0.3 mg

## 2013-06-11 ENCOUNTER — Telehealth: Payer: Self-pay | Admitting: Internal Medicine

## 2013-06-11 NOTE — Telephone Encounter (Signed)
Pt states she has been trying to get refills on her meds.  States her pharmacy has sent requests with no response.  She did not know the name of all of the meds that need refills and would like a call regarding this.  Stated she did know she needs: zantac, L-methylfolate, singulair, albuterol.  Says she needs one of her daily allergy medications as well but could not remember the name.

## 2013-06-12 MED ORDER — MONTELUKAST SODIUM 10 MG PO TABS: 10.0000 mg | ORAL_TABLET | Freq: Every day | ORAL | Status: DC

## 2013-06-12 MED ORDER — L-METHYLFOLATE 15 MG PO TABS: 1.0000 | ORAL_TABLET | Freq: Every day | ORAL | Status: DC

## 2013-06-12 MED ORDER — RANITIDINE HCL 300 MG PO TABS: 300.0000 mg | ORAL_TABLET | Freq: Every day | ORAL | Status: DC

## 2013-06-12 NOTE — Telephone Encounter (Signed)
Medication sent to pharmacy as requested. Left message to notify patient on voicemail .

## 2013-06-16 ENCOUNTER — Ambulatory Visit: Payer: 59 | Admitting: Internal Medicine

## 2013-06-27 ENCOUNTER — Other Ambulatory Visit: Payer: Self-pay | Admitting: *Deleted

## 2013-06-27 ENCOUNTER — Encounter: Payer: Self-pay | Admitting: Internal Medicine

## 2013-06-27 ENCOUNTER — Ambulatory Visit (INDEPENDENT_AMBULATORY_CARE_PROVIDER_SITE_OTHER): Payer: BC Managed Care – PPO | Admitting: Internal Medicine

## 2013-06-27 VITALS — BP 130/78 | HR 106 | Temp 98.0°F | Resp 18 | Wt 196.8 lb

## 2013-06-27 DIAGNOSIS — J0110 Acute frontal sinusitis, unspecified: Secondary | ICD-10-CM

## 2013-06-27 DIAGNOSIS — K589 Irritable bowel syndrome without diarrhea: Secondary | ICD-10-CM

## 2013-06-27 DIAGNOSIS — R6889 Other general symptoms and signs: Secondary | ICD-10-CM

## 2013-06-27 LAB — POCT INFLUENZA A/B
Influenza A, POC: NEGATIVE
Influenza B, POC: NEGATIVE

## 2013-06-27 MED ORDER — EPINEPHRINE 0.3 MG/0.3ML IJ SOAJ: 0.3000 mg | Freq: Once | INTRAMUSCULAR | Status: DC

## 2013-06-27 MED ORDER — BENZONATATE 200 MG PO CAPS: 200.0000 mg | ORAL_CAPSULE | Freq: Three times a day (TID) | ORAL | Status: DC | PRN

## 2013-06-27 MED ORDER — LEVOFLOXACIN 500 MG PO TABS: 500.0000 mg | ORAL_TABLET | Freq: Every day | ORAL | Status: DC

## 2013-06-27 MED ORDER — PREDNISONE (PAK) 10 MG PO TABS: ORAL_TABLET | ORAL | Status: DC

## 2013-06-27 NOTE — Progress Notes (Signed)
Patient ID: Susan Sullivan, female   DOB: 1976/05/08, 38 y.o.   MRN: [id number]  Patient Active Problem List   Diagnosis Date Noted  . Sinusitis, acute frontal 06/29/2013  . Irritable bowel syndrome 06/03/2013  . Abdominal pain, other specified site 05/19/2013  . Hypokalemia 01/23/2013  . Anxiety state, unspecified 08/06/2012  . Tachycardia 08/06/2012  . 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 Infectious mononucleosis 03/29/2012  . Colicky RUQ abdominal pain 02/25/2012  . Back pain, thoracic 02/23/2012  . Elevated blood pressure reading without diagnosis of hypertension 01/07/2012  . 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 Oral allergy syndrome 05/14/2011  . Pre-conception counseling 05/14/2011  . Lipids abnormal 05/14/2011  . 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 History of ectopic pregnancy   . Spinal stenosis of lumbar region     Subjective:  CC:   Chief Complaint  Patient presents with  . Follow-up    raspy voice , fever, chills and headache  . Cough    productive, brownish colored mucus.    HPI:   Susan Sullivan is a 38 y.o. female who presents for Follow up on recurrent post prandial diarrhea.  She was given rx for bentyl which has helped significantly.   Diarrhea frequency has been reduced to once or twice per week.  New onset Frontal headache accompanied by purulent brown  sinus drainage ,  Started with sore throat 3 days ago. Can't take sudafed due to tachycardia .  No fevers but facial pain is considerable .      Past Medical History  Diagnosis Date  . Ectopic pregnancy, tubal Sept 2012    left,  with rupture ,  and concurrent right ovarian cyst rupture  . 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 MTHFR (methylene THF reductase) deficiency and homocystinuria   . Complication of anesthesia     ketamin - difficulty waking up  . Dysrhythmia     tachycardia - sees dr.  Fletcher Anon  . Gestational diabetes     "only when I'm pregnant" (11/13/2012)  . Degenerative disk disease     not on medication due to attempts to get pregant  . Chronic lower back pain     Past Surgical History  Procedure Laterality Date  . Cesarean section  2002; 2010  . Dilation and curettage of uterus  2012; 2013  . Cholecystectomy  Dec 2012    Jason Dew, [facility]  . Posterior lumbar fusion  11/13/2012  . Augmentation mammaplasty Bilateral 2007  . Laparoscopy for ectopic pregnancy  2012  . Spine surgery  2007    SI joint fusion [facility]  . Spine surgery  June 2014    Earle Gell  L4-L5       The following portions of the patient's history were reviewed and updated as appropriate: Allergies, current medications, and problem list.    Review of Systems:   Patient denies headache, fevers, malaise, unintentional weight loss, skin rash, eye pain, sinus congestion and sinus pain, sore throat, dysphagia,  hemoptysis , cough, dyspnea, wheezing, chest pain, palpitations, orthopnea, edema, abdominal pain, nausea, melena, diarrhea, constipation, flank pain, dysuria, hematuria, urinary  Frequency, nocturia, numbness, tingling, seizures,  Focal weakness, Loss of consciousness,  Tremor, insomnia, depression, anxiety, and suicidal ideation.     History   Social History  . Marital Status: Married    Spouse Name: N/A    Number of Children: N/A  .  Years of Education: N/A   Occupational History  . Not on file.   Social History Main Topics  . Smoking status: Never Smoker   . Smokeless tobacco: Never Used  . Alcohol Use: No  . Drug Use: No  . Sexual Activity: Yes   Other Topics Concern  . Not on file   Social History Narrative  . No narrative on file    Objective:  Filed Vitals:   06/27/13 1604  BP: 130/78  Pulse: 106  Temp: 98 F (36.7 C)  Resp: 18     General appearance: alert, cooperative and appears stated age Ears: normal TM's and external ear canals both  ears Throat: lips, mucosa, and tongue normal; teeth and gums normal Neck: no adenopathy, no carotid bruit, supple, symmetrical, trachea midline and thyroid not enlarged, symmetric, no tenderness/mass/nodules Back: symmetric, no curvature. ROM normal. No CVA tenderness. Lungs: clear to auscultation bilaterally Heart: regular rate and rhythm, S1, S2 normal, no murmur, click, rub or gallop Abdomen: soft, non-tender; bowel sounds normal; no masses,  no organomegaly Pulses: 2+ and symmetric Skin: Skin color, texture, turgor normal. No rashes or lesions Lymph nodes: Cervical, supraclavicular, and axillary nodes normal.  Assessment and Plan:  Irritable bowel syndrome Well controlled on current regimen of bentyl. no changes today.  Sinusitis, acute frontal Given chronicity of symptoms, development of facial pain and exam consistent with bacterial URI,  Will treat with empiric antibiotics, decongestants, and saline lavage.  Adding steroid nasal spray if not already taking.    Updated Medication List Outpatient Encounter Prescriptions as of 06/27/2013  Medication Sig  . acyclovir (ZOVIRAX) 400 MG tablet Take 400 mg by mouth daily as needed. Cold sores  . albuterol (PROAIR HFA) 108 (90 BASE) MCG/ACT inhaler Inhale 2 puffs into the lungs every 6 (six) hours as needed for wheezing.  [name] ALPRAZolam (XANAX) 0.25 MG tablet TAKE ONE TABLET BY MOUTH 2 TIMES A DAY AS NEEDED FOR SLEEP  . amitriptyline (ELAVIL) 10 MG tablet Take 10 mg by mouth at bedtime.  [name] aspirin 81 MG tablet Take 81 mg by mouth daily.  . cetirizine (ZYRTEC) 10 MG tablet Take 10 mg by mouth daily.  . cyclobenzaprine (FLEXERIL) 10 MG tablet Take 1 tablet (10 mg total) by mouth 3 (three) times daily as needed for muscle spasms.  . diazepam (VALIUM) 5 MG tablet Take 1 tablet (5 mg total) by mouth every 6 (six) hours as needed (spasms).  . dicyclomine (BENTYL) 20 MG tablet Take 1 tablet (20 mg total) by mouth 4 (four) times daily -  before  meals and at bedtime.  . diphenhydrAMINE (BENADRYL) 25 MG tablet Take 25 mg by mouth every 6 (six) hours as needed for itching or allergies.  [name] EPINEPHrine (EPI-PEN) 0.3 mg/0.3 mL SOAJ injection Inject 0.3 mLs (0.3 mg total) into the muscle once.  [name] L-Methylfolate 15 MG TABS Take 1 tablet (15 mg total) by mouth daily.  . methocarbamol (ROBAXIN) 500 MG tablet Take 500 mg by mouth every 6 (six) hours as needed.  . metoprolol tartrate (LOPRESSOR) 25 MG tablet Take 12.5 mg by mouth 2 (two) times daily as needed. For tachycardia  . montelukast (SINGULAIR) 10 MG tablet Take 1 tablet (10 mg total) by mouth at bedtime.  . ondansetron (ZOFRAN ODT) 4 MG disintegrating tablet Take 1 tablet (4 mg total) by mouth every 8 (eight) hours as needed for nausea.  . ranitidine (ZANTAC) 300 MG tablet Take 1 tablet (300 mg total) by mouth at  bedtime.  . traMADol (ULTRAM) 50 MG tablet Take 1 tablet (50 mg total) by mouth every 6 (six) hours as needed for pain.  . benzonatate (TESSALON) 200 MG capsule Take 1 capsule (200 mg total) by mouth 3 (three) times daily as needed for cough.  [name] levofloxacin (LEVAQUIN) 500 MG tablet Take 1 tablet (500 mg total) by mouth daily.  . predniSONE (STERAPRED UNI-PAK) 10 MG tablet 6 tablets on Day 1 , then reduce by 1 tablet daily until gone     Orders Placed This Encounter  Procedures  . POCT Influenza A/B    No Follow-up on file.

## 2013-06-27 NOTE — Telephone Encounter (Signed)
Refilled Epi-Pen Rx

## 2013-06-27 NOTE — Patient Instructions (Signed)
You have a sinus/ear infection. I am prescribing an antibiotic (levaquin)  and prednisone taper  To manage the infection and the inflammation in your ear/sinuses.   I also advise use of the following OTC meds to help with your other symptoms.   Take generic OTC benadryl 25 mg every 8 hours for the drainage,   you may use Afrin nasal spray a max of 5 days for congestion .  flush your sinuses twice daily with Simply Saline (do over the sink because if you do it right you will spit out globs of mucus)  Use benzonatate capsules or OTC  Delsym   FOR THE COUGH.  Please take a probiotic ( Align, Florajen or Culturelle) while you are on the antibiotic to prevent a serious antibiotic associated diarrhea  Called clostirudium dificile colitis and a vaginal yeast infection

## 2013-06-27 NOTE — Progress Notes (Signed)
Pre-visit discussion using our clinic review tool. No additional management support is needed unless otherwise documented below in the visit note.  

## 2013-06-29 ENCOUNTER — Encounter: Payer: Self-pay | Admitting: Internal Medicine

## 2013-06-29 DIAGNOSIS — J0110 Acute frontal sinusitis, unspecified: Secondary | ICD-10-CM | POA: Insufficient documentation

## 2013-06-29 NOTE — Assessment & Plan Note (Signed)
Given chronicity of symptoms, development of facial pain and exam consistent with bacterial URI,  Will treat with empiric antibiotics, decongestants, and saline lavage.  Adding steroid nasal spray if not already taking.  °

## 2013-06-29 NOTE — Assessment & Plan Note (Signed)
Well controlled on current regimen of bentyl. no changes today.

## 2013-09-11 ENCOUNTER — Encounter: Payer: Self-pay | Admitting: Internal Medicine

## 2013-09-12 ENCOUNTER — Other Ambulatory Visit: Payer: Self-pay | Admitting: Internal Medicine

## 2013-09-12 NOTE — Telephone Encounter (Signed)
Ok refill? 

## 2013-09-14 NOTE — Telephone Encounter (Signed)
Ok to refill,  Refill sent  

## 2013-10-08 ENCOUNTER — Emergency Department: Payer: Self-pay | Admitting: Otolaryngology

## 2013-12-12 ENCOUNTER — Encounter: Payer: Self-pay | Admitting: Internal Medicine

## 2013-12-14 MED ORDER — TRAMADOL HCL 50 MG PO TABS: 50.0000 mg | ORAL_TABLET | Freq: Four times a day (QID) | ORAL | Status: DC | PRN

## 2013-12-14 NOTE — Telephone Encounter (Signed)
Tramadol refill authorized 

## 2013-12-15 NOTE — Telephone Encounter (Signed)
Refill faxed as requested to [facility].

## 2014-01-08 ENCOUNTER — Encounter: Payer: Self-pay | Admitting: Internal Medicine

## 2014-01-08 ENCOUNTER — Telehealth: Payer: Self-pay | Admitting: Internal Medicine

## 2014-01-08 NOTE — Telephone Encounter (Signed)
Pt notified and verbalized understanding.

## 2014-01-08 NOTE — Telephone Encounter (Signed)
Pt request an appt to be seen for slurred speech and dysphagia. Pt was given the option to be seen by the NP and to speak with the triage nurse. The patient refused. Pt stated that this is not an acute and that she only wants to be seen by Dr. Derrel Sullivan. Please advise

## 2014-01-08 NOTE — Telephone Encounter (Signed)
Honestly, I would not know what labs to check and what to do with them. 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 Since I am officially out of the office as of 2 hours ago, and off tomorrow,  I would contact them.

## 2014-01-08 NOTE — Telephone Encounter (Signed)
States Dr. Derrel Nip is familiar with her condition, states over the last few weeks, she has increased episodes of aphasia, slurred speech at times, difficulty reading discharge summaries to patients, has worsened over last few days. Feels she needs to have some labs drawn, homocystine and anything else she recommends?

## 2014-01-19 DIAGNOSIS — Z659 Problem related to unspecified psychosocial circumstances: Secondary | ICD-10-CM | POA: Insufficient documentation

## 2014-01-19 DIAGNOSIS — O2620 Pregnancy care for patient with recurrent pregnancy loss, unspecified trimester: Secondary | ICD-10-CM | POA: Insufficient documentation

## 2014-01-19 DIAGNOSIS — E7210 Disorders of sulfur-bearing amino-acid metabolism, unspecified: Secondary | ICD-10-CM | POA: Insufficient documentation

## 2014-05-09 IMAGING — RF Imaging study
1 series · 2 of 2 positions shown · non-contrast
Comparison: Intraoperative views earlier the same date.  CT
10/07/2012.

CLINICAL DATA: L4-L5 PLIF.

DG C-ARM 1-60 MIN,LUMBAR SPINE - 2-3 VIEW

[Series 1: run · 2 of 2 slices shown]
[im 1/2]
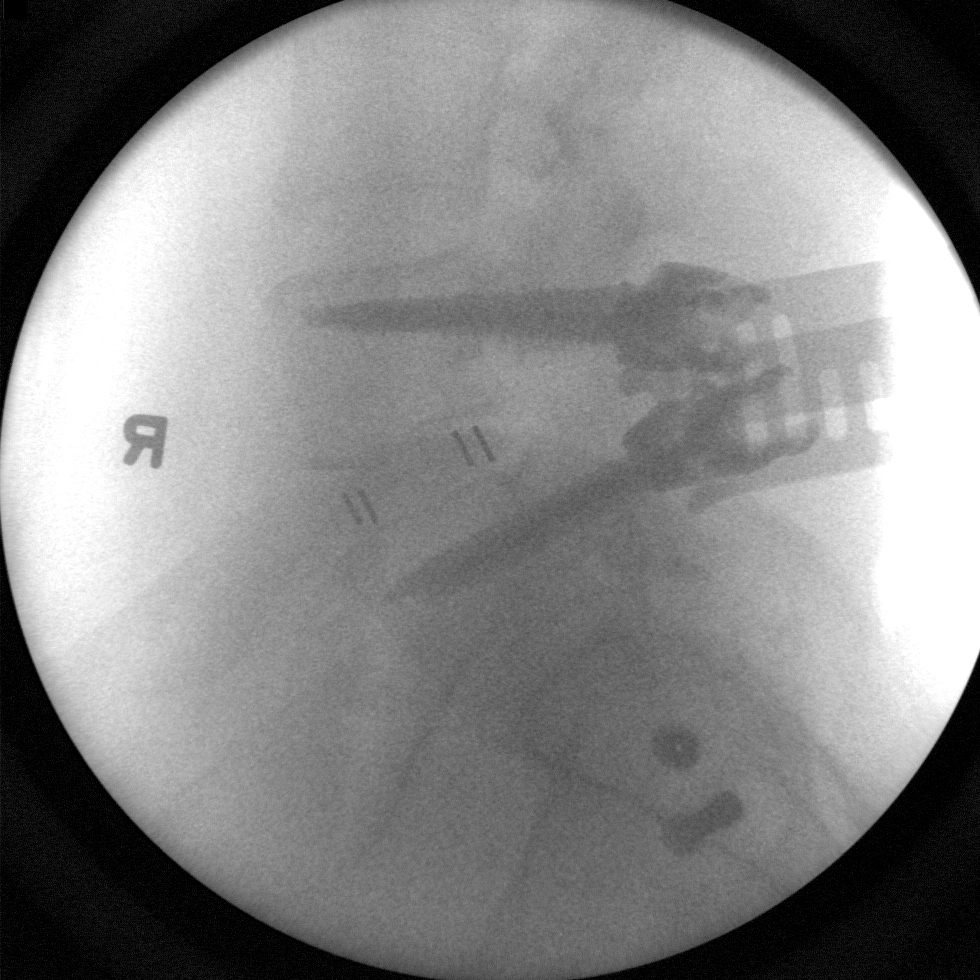
[im 2/2]
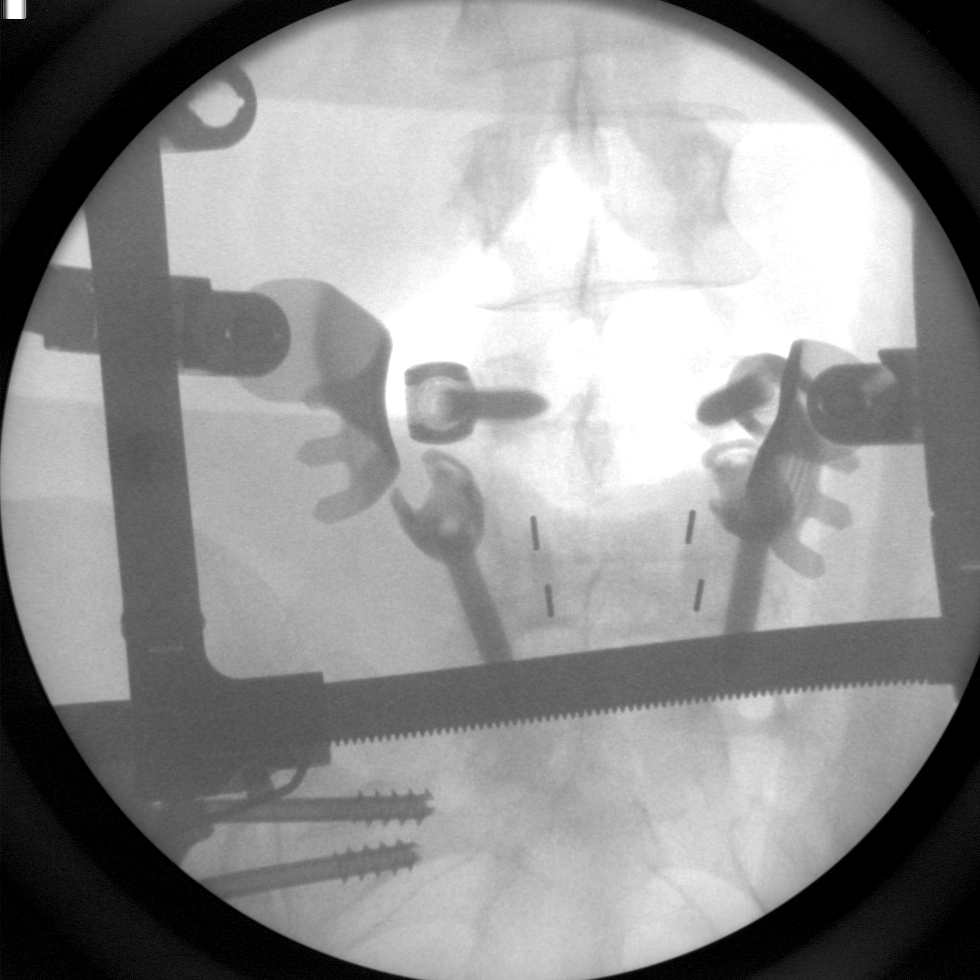

[2 of 2 positions shown; findings below may reference images not displayed]

FINDINGS: Technique:  C-arm fluoroscopic images were obtained
intraoperatively and submitted for postoperative interpretation.
Please see the performing provider's procedural report for the
fluoroscopy time utilized.

Spot fluoroscopic images of the lower lumbar spine are numbered as
on the prior studies with a transitional L5 segment.  These images
demonstrate the placement of bilateral pedicle screws at L4 and L5.
Interconnecting rods have not yet been placed.  Interbody spacer
appears well positioned.  The patient is status post fixation of
the right sacroiliac joint.
IMPRESSION: Intraoperative views during L4-L5 fusion as described.

## 2014-09-11 NOTE — Discharge Summary (Signed)
PATIENT NAME:  Susan Sullivan, Susan Sullivan MR#:  [id number] DATE OF BIRTH:  [birth year]  DATE OF ADMISSION:  11/20/2012 DATE OF TRANSFER:  11/23/2012  The patient will be getting transferred to [facility] for further neurosurgical evaluation.   PRIMARY CARE PHYSICIAN: Deborra Medina, MD.   ACCEPTING PHYSICIAN:  Dr. Rita Ohara from Neurosurgery.  CURRENT DIAGNOSES:  1.  Intractable pain in a setting of spinal fusion surgery done on 11/13/2012 at [facility].  2.  Slight transaminitis, improved. 3.  Hyperkalemia resolved.  4.  A history of chronic back pain.  5.  A history of degenerative joint disease.  6.  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  7.  A history of chronic tachycardia followed by Dr. Fletcher Anon. 8.  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   CURRENT MEDICATIONS: Baclofen 10 mg t.i.d., Zyrtec 10 mg daily, Protonix 40 mg daily, MiraLAX 17 grams b.i.d., heparin 5000 units q.8 hours, albuterol oral inhaler 2 puffs q.4 hours with spacer, fentanyl patch 75 mcg q.3 days, Flexeril 10 mg q.8 hours p.r.n., Colace 100 mg b.i.d. p.r.n. for constipation, Dulcolax suppository 10 mg rectally p.r.n., Senna 1 tab b.i.d. p.r.n. for constipation, Valium 5 mg IV q.2 hours p.r.n. for muscle spasms.  SIGNIFICANT LABS AND IMAGING:  Urinalysis on arrival:  1+ leukocyte esterase, 4 WBC, trace bacteria. Blood cultures on July 3rd:  No growth to date x 2. Initial WBC was 7.9. ESR was 106, hemoglobin at 11.2, and platelets were 342. Today, WBCs is 7, hemoglobin is 10.3, platelets are 335. CK total on July 3rd was 123. Initial LFTs showed albumin of 3.2, AST 53, ALT 80. On July 3rd:  AST was 40 and ALT was 62, albumin of 2.8. Initial potassium 3.4, sodium 139, creatinine 0.73. Initial CT lumbar spine without contrast shows postop changes. L4-L5 fusion, L5 vertebral body may be transitional, L4 vertebral body is seen on images 53 to 59, good anatomical bony alignment. Orthopaedic hardware appears to  be intact, no bony spinal stenosis. Noted in the epidural space and retro spinal soft tissue at L4-L5 are tiny air pockets. These changes may be from recent surgery. No large fluid collection are noted. No air-fluid levels are noted. Repeat CT on July 4th shows a posterior spinal fusion hardware in the lower lumbar spine. Small foci of epidural tissue at the level are slightly decreased from prior and likely related to resolving postop changes. Infection is not excluded. Ultrasound of abdomen showed no acute findings, prior cholecystectomy, findings which raise the possibility of hepatic steatosis.  HISTORY OF PRESENT ILLNESS AND HOSPITAL COURSE:  For full details of H and P, please see the dictation on July 2 by Dr. 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 Of note, she had a spinal surgery on 06/252014 with spinal fusion and rods at [facility]. She came in because she stated that she could not tolerate the pain and had no bowel movements. She stated that she could not function. She could not move. She states that she had been lying down in bed and had been unable to do much. The pain is described as 10/10, and therefore she was admitted to the hospitalist service. She was started on a fentanyl drip with a fentanyl patch. A CT of the spine was performed, which the result as dictated above. She was initially admitted to the CCU for observation the fentanyl PCA. She described having postoperative fevers,  low-grade but she did not have a fever here. 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 Aspirin was not given for postop reasons here. She has been placed on heparin for DVT prophylaxis. She did have low potassium and some tachycardia, The potassium was repleted. In regards to her pain, it did not look like the fentanyl drip controlled her pain and the rate was adjusted with a longer lockout  period but with a higher dose of boluses with standing fentanyl rate and later Valium and baclofen was added in addition to Flexeril. The patient still has significant pain issues. She does not have any weakness or numbness in the lower extremities, but has some shooting pains in the right lower extremity but it is improved since preop. Given persistent severe pain in her spine,  Neurosurgery at [facility] was contacted, who went over the CAT scans and stated that the CAT scans looked good, but for persistent postoperative pain, she will be transferred to [facility] for further evaluation and care, and at this point, she still on the fentanyl drip.   TOTAL TIME SPENT:  35 additional minutes.   CODE STATUS:  The patient is a FULL CODE.   ____________________________ Vivien Presto, MD sa:jm D: 11/23/2012 09:41:34 ET T: 11/23/2012 10:05:35 ET JOB#: [id number]  cc: Vivien Presto, MD, <Dictator> Deborra Medina, MD Vivien Presto MD ELECTRONICALLY SIGNED 12/21/2012 11:38

## 2014-09-11 NOTE — H&P (Signed)
PATIENT NAME:  Susan Sullivan, Susan Sullivan MR#:  [id number] DATE OF BIRTH:  [birth year]  DATE OF ADMISSION:  11/20/2012  CHIEF COMPLAINT: Severe intractable pain of the back.   SURGEON PERFORMING SPINAL SURGERY: Dr. Arnoldo Morale, [facility]  PRIMARY CARE PHYSICIAN: Deborra Medina, MD   HISTORY OF PRESENT ILLNESS: Ms. Susan Sullivan is a very nice 39 year old female. 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 She comes today with a history of having spinal surgery on 11/13/2012, a spinal fusion with multiple rods.  Apparently, the spinal fusion was not supposed to be as large as it was, but once they looked intraoperatively, the problem that she had was worse than they thought and ended up  doing more extensive surgery. The patient was discharged 2 days after with significant pain, and she comes today because she cannot tolerate it at home. The patient has not had a bowel movement since prior to the 23rd of June, and she feels very bloated as well. She has significant pain. She cannot function.  She cannot move.  She has been lying down in bed all the time and not able to do anything. Her pain is 10 out of 10. There is no radiation. There is no numbness or tingling of the lower extremities. There is no urinary or fecal incontinence. The patient comes to this hospital because she really is not able to mobilize, and her pain is "killing her." The patient states that she does not want to go to [facility] because when she was there she was given Dilaudid. She went into respiratory distress, given Narcan after that, and she felt like she will be better taken care of over here. The patient had a transition to pain pills, Percocet, and Percocet is not working. Today, her LFTs are elevated. She had a temperature of 100.8, and she is evaluated here in the ER.  She had a CT scan done to evaluate for possible infection showing postoperative changes of multiple pockets of air. No air-fluid  levels. Changes might be related to postsurgical changes, although the radiologist recommended a neurosurgical evaluation to make sure there was not any infection there. The patient is informed about these findings. She does not want to go to [facility].  She feels like she is better over here, despite the fact that we advised her that it is better to go to the place where she had her surgery.  She just states that she wants to stay here. We are going to try to get Neurosurgery tomorrow on the phone to take a look at the CT scan. The patient is admitted for pain control.   REVIEW OF SYSTEMS:  CONSTITUTIONAL: Positive fever, positive fatigue, weakness, no weight loss or weight gain.   EYES: No blurry vision, double vision. Positive redness of the eyes due to the pain and the patient crying.  ENT: Denies any tinnitus, difficulty swallowing. No postnasal drip.  RESPIRATION: Denies any cough, wheezing, COPD or painful respiration. She has been taking very shallow breaths.  CARDIOVASCULAR: No chest pain, orthopnea, edema or syncope.  GASTROINTESTINAL: No nausea, vomiting, diarrhea.  No abdominal pain. Positive abdominal distention, positive severe constipation. Positive GERD.  GENITOURINARY: No dysuria, hematuria, although the patient states that she needs to push is really hard to urinate and drinks a lot of fluids. She had a Foley catheter during this hospitalization at [facility].  GYNECOLOGIC: No breast masses.  ENDOCRINE: No polyuria, polydipsia, polyphagia, cold or heat intolerance.  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  SKIN:  No acne, rashes or petechiae.  MUSCULOSKELETAL: No significant neck pain. Positive severe back pain, back spasms. No gout.  NEUROLOGIC: No numbness, tingling. She had neuropathy of the lower extremities due to her problems with the spine, but  it has gotten a little bit better after surgery.  PSYCHIATRIC: Negative for insomnia or depression prior to this.    PAST MEDICAL HISTORY: 1.  Chronic back pain.  2.  Degenerative joint disease.  3.  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  4.  ATMHF.    5.  History of chronic tachycardia, followed by Dr. Fletcher Anon.  6.  Food allergies.  7.  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   ALLERGIES: DILAUDID, GABAPENTIN, CYMBALTA, CLINDAMYCIN, LATEX. THE PATIENT WAS PUT ON AS AN ALLERGY OF FENTANYL, BUT THE PATIENT STATES THAT SHE HAS BEEN TAKING FENTANYL WITHOUT A PROBLEM. OYSTER SHELL.  THE PATIENT HAS BEEN PUT OVER HERE THAT SHE IS ALLERGIC TO PERCOCET, BUT SHE HAS BEEN TAKING PERCOCET OUTPATIENT.   SUDAFED ALLERGY, VICODIN AS AN ALLERGY.   PAST SURGICAL HISTORY: 1.  On 11/13/2012, a spinal fusion with rods.  2.  In 2007, SI fusion.   SOCIAL HISTORY: Denies any tobacco use. She does not drink. She lives with her husband and 3 kids. 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   MEDICATIONS: Zyrtec 10 mg once daily, Zantac 300 mg once daily, Xanax 0.25 mg 3 times daily, vitamin D3 2000 units once a day, vitamin C 500 units once daily with vitamin B6 100 mg once daily, Ventolin HFA 90 mcg 2 puffs 4 times a day, Valium 5 mg 4 times a day, Tramadol 50 mg 4 times a day, Singulair 10 mg once a day, Flexeril 10 mg as needed for muscle contractions,  EpiPen as needed,  Deplin 15 mg once a day, Benadryl 25 mg 3 times daily, aspirin 81 mg once daily.   NOTE:  She has been told to stop the aspirin until cleared up by her neurosurgeon.   PHYSICAL EXAMINATION: VITAL SIGNS: Blood pressure 130/79, pulse 109 to 113, respirations 18, temperature 98.3 here at the hospital, oxygen saturation 98% on room air.  GENERAL: The patient is alert, oriented x 3. Severe distress due to pain, not able to get comfortable.   HEENT: Pupils are equal and reactive. Extraocular movements are intact. Mucosa are dry. Anicteric sclerae. Pink conjunctivae. No oral lesions. No oropharyngeal exudates.  NECK: Supple. No JVD. No thyromegaly. No adenopathy. No carotid bruits.  No rigidity of the neck.  CARDIOVASCULAR: Regular rate and rhythm. No murmurs, rubs or gallops. The patient is tachycardic. No displacement of PMI.  LUNGS: Showing some crackling on  the bases with decreased respiratory sounds on the bases as well, likely due to atelectasis. No dullness to percussion. No use of accessory muscles.  ABDOMEN: Soft, slightly distended, mild tender to palpation at the level of the colonic flexures. No rebound tenderness. No guarding. Bowel sounds are increased, a little bit hyperactive.  GENITAL: Exam deferred.  EXTREMITIES: No edema, cyanosis or clubbing.  VASCULAR: Pulses +2. Capillary refill less than 3.   LYMPHATIC: Negative for lymphadenopathy of the neck or supraclavicular areas.  SKIN: No  rashes, petechiae or new moles.  NEUROLOGIC: Cranial nerves II through XII are intact. Strength is equal in 4 extremities 5 out of 5.  Lower extremities have equal strength, no significant foot drop or inversion of the feet. No lack of sensation. Sphincter tone is not checked, but the patient has been constipated. The patient is not having any urinary incontinence here. The patient is able to move lower extremities without problem.  PSYCHIATRIC:  The patient is anxious, agitated due to the pain.   LABORATORY AND RADIOLOGICAL DATA:  As  mentioned above, CT scan findings:  The  patient has sinus tachycardia on monitor. Urinalysis:  4 white blood cells, positive leukocyte esterase, trace bacteria.  White count 7.9, ESR 106, hemoglobin 11.2, platelet count 342, AST 53, ALT 80, potassium 3.4, BUN 5, creatinine 0.73.  Chest x-ray:  Decreased respiratory volumes, possible atelectasis.   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   1.  Intractable pain:  The patient had severe pain, not able to take medications orally that help. The patient is actually exhibiting signs of increased LFTs for which we are going to diminish any amount of Tylenol that she can get.  She had a fever, but there is not an evident abscess or significant infection of the lower spine. This seems to be all postoperative changes. The patient is going to be put on fentanyl PCA as she cannot take Dilaudid. She cannot take morphine. The patient is going to be observed on the CCU, the step-down because of this pump as a protocol of the hospital. I checked this with nurse supervisor, and she told me that it is better to keep her on the CCU.  The patient is going to be monitored closely.  Continuous pulse ox is going to be recommended.  2.  Constipation:  We are going to give her MiraLax and add on suppositories and enemas. We are going to add on right now mag citrate.  3.  Fever:  As far as the fever, this is a very low-grade fever, likely due to postoperative changes, the patient not moving much, not getting around.  She is very constipated. She has decreased pulmonary volumes and atelectasis. This is likely to be the cause of the low-grade fever.  Although she had a Foley catheter, we are going to do a urine culture just to make sure she does not have a urinary infection.  Her urinalysis is borderline.  4.  The patient has history of coagulopathy for which she had multiple miscarriages.  The patient has been taking aspirin. I cannot give her aspirin right now as she is on the postoperative of her surgery.  She might be able to take it soon, but for now for DVT prophylaxis we are going to encourage her to use PLCs and SCDs.  5.  Tachycardia: This is chronic.  6.  Low potassium: Replaced.  7.  Mild dehydration:  IV fluids stat.  8.  Mild anemia:  This is due to postoperative,  hemoglobin 11.2, is stable.  Add iron after the patient is more stable.  9.  Other medical problems seem to be stable.  10.  Deep vein thrombosis prophylaxis with SCDs and gastrointestinal prophylaxis with Protonix.   CODE STATUS:  The patient is a FULL CODE.     TIME SPENT: I spent about 65 minutes with this patient.    ____________________________ Roselie Awkward  James Ivanoff, MD rsg:cb D: 11/20/2012 20:25:54 ET T: 11/20/2012 20:49:02 ET JOB#: [id number]  cc: McKittrick Sink, MD, <Dictator> Zavior Thomason America Brown MD ELECTRONICALLY SIGNED 11/30/2012 16:37

## 2014-09-13 NOTE — Op Note (Signed)
PATIENT NAME:  Susan Sullivan, Susan Sullivan MR#:  [id number] DATE OF BIRTH:  [birth year]  DATE OF PROCEDURE:  05/17/2011  PREOPERATIVE DIAGNOSIS: Biliary dyskinesia.   POSTOPERATIVE DIAGNOSIS:  Biliary dyskinesia with chronic cholecystitis.   PROCEDURE: Laparoscopic cholecystectomy.   SURGEON: Algernon Huxley, M.D.   ANESTHESIA: General.  ESTIMATED BLOOD LOSS: Minimal.   INDICATION FOR PROCEDURE: The patient is a 39 year old white female with postprandial substernal pain which radiates to her back. She had an ultrasound which did not show gallstones. However, her HIDA scan showed a significantly reduced gallbladder ejection fraction consistent with biliary dyskinesia. I discussed with her options. She desired to have cholecystectomy for treatment of this. Risks and benefits were discussed. Informed consent was obtained.   DESCRIPTION OF PROCEDURE: The patient was brought to the operative suite and after an adequate level of general anesthesia was obtained, her abdomen was sterilely prepped and draped and a sterile surgical field was created. A small vertical incision was made at the base of the umbilicus and I entered the peritoneum sharply and put 2-0 Vicryl sutures on either side of the fascia. The Hasson cannula was then placed and the abdomen was insufflated with carbon dioxide. A subxiphoid and two right upper quadrant ports were placed under direct visualization. On entry there were significant omental adhesions to the gallbladder consistent with chronic cholecystitis. These were teased down bluntly without difficulty and the gallbladder was then retracted cephalad with lowest right upper quadrant port.  I then used the subxiphoid port with a IT consultant and a blunt grasper from the high right upper quadrant port to dissect out the gallbladder/cystic duct junction. One clip was placed on the gallbladder and two on the cystic duct and this was transected. I then dissected out the cystic artery and  double-clipped this proximally and singly-clipped it distally and transected that. At this point the gallbladder was dissected free from the liver bed using electrocautery. It was teased off the liver without significant difficulty. There was a rent in the gallbladder and some leakage of bile, but this was copiously irrigated until the field was clear.  The gallbladder was then removed with a retrieval bag through the umbilical incision after placing the camera in the subxiphoid incision and sent to pathology. The Hasson cannula was then replaced and the abdomen was copiously irrigated with sterile saline until the effluent return was clear. The clips were in place. There was no leakage of bile or blood and the liver bed was intact. At this point I elected to terminate the procedure. All  ports were removed under direct visualization. The abdomen was desufflated of carbon dioxide.  The two previously placed 2-0 Vicryl sutures were tied together and the skin was closed with 4-0 Monocryl in all four incisions. Dermabond was placed as a dressing. The patient tolerated the procedure well and was taken to the recovery room in stable condition.    ____________________________ Algernon Huxley, MD [id number] D: 05/17/2011 17:43:39 ET T: 05/18/2011 09:44:41 ET JOB#: [id number]  cc: Algernon Huxley, MD, <Dictator> Algernon Huxley MD ELECTRONICALLY SIGNED 06/09/2011 7:54

## 2014-09-13 NOTE — Op Note (Signed)
PATIENT NAME:  Susan Sullivan, Susan Sullivan MR#:  [id number] DATE OF BIRTH:  February 04, 1976  DATE OF PROCEDURE:  09/12/2011  PREOPERATIVE DIAGNOSES:  1. Retained products. 2. Endometrial thickening/abnormality.   POSTOPERATIVE DIAGNOSES: 1. Retained products. 2. Endometrial thickening/abnormality.   PROCEDURE: Suction dilatation and curettage.   SURGEON: Glean Salen, MD   ANESTHESIA: General.   ESTIMATED BLOOD LOSS: Minimal.   COMPLICATIONS: None.   SPECIMEN: Endometrial curettings.   DISPOSITION: To recovery room stable.   TECHNIQUE: The patient is prepped and draped in the usual sterile fashion after adequate anesthesia is obtained in the dorsal lithotomy position. Bladder is drained with a Robinson catheter. A speculum is placed and the anterior lip of the cervix is grasped with a tenaculum. The cervix is dilated and the uterus is sounded to 8 cm. Gentle curettage is performed with a banjo curette. An 8 mm suction curette is used with aspiration of products of conception although minimal tissue was obtained. Repeat curettage is performed and specimen sent to pathology for further review. The tenaculum is removed with excellent hemostasis noted.      The patient tolerates the procedure well and goes to the recovery room in stable condition. All sponge, instrument, and needle counts are correct.   ____________________________ R. Barnett Applebaum, MD rph:drc D: 09/22/2011 10:20:07 ET T: 09/22/2011 11:13:59 ET JOB#: [id number]  cc: Glean Salen, MD, <Dictator> Gae Dry MD ELECTRONICALLY SIGNED 09/26/2011 23:27

## 2014-10-09 NOTE — Telephone Encounter (Signed)
Closing this encounter since it is from 2013 I am sure this has been handled.

## 2015-02-10 ENCOUNTER — Other Ambulatory Visit
Admit: 2015-02-10 | Discharge: 2015-02-10 | Disposition: A | Discharge status: Home / Self Care | Payer: BC Managed Care – PPO | Attending: Obstetrics and Gynecology | Admitting: Obstetrics and Gynecology

## 2015-02-10 DIAGNOSIS — Z1321 Encounter for screening for nutritional disorder: Secondary | ICD-10-CM | POA: Insufficient documentation

## 2015-02-16 LAB — VITAMIN D 1,25 DIHYDROXY
VITAMIN D3 1, 25 (OH): 31 pg/mL
Vitamin D 1, 25 (OH)2 Total: 36 pg/mL
Vitamin D2 1, 25 (OH)2: <10 pg/mL

## 2015-05-27 ENCOUNTER — Encounter: Payer: Self-pay | Admitting: Obstetrics and Gynecology

## 2015-05-27 ENCOUNTER — Ambulatory Visit (INDEPENDENT_AMBULATORY_CARE_PROVIDER_SITE_OTHER): Payer: BC Managed Care – PPO | Admitting: Obstetrics and Gynecology

## 2015-05-27 VITALS — BP 114/77 | HR 105 | Ht 67.0 in | Wt 173.4 lb

## 2015-05-27 DIAGNOSIS — N920 Excessive and frequent menstruation with regular cycle: Secondary | ICD-10-CM

## 2015-05-27 DIAGNOSIS — E7212 Methylenetetrahydrofolate reductase deficiency: Secondary | ICD-10-CM

## 2015-05-27 DIAGNOSIS — Z1589 Genetic susceptibility to other disease: Secondary | ICD-10-CM

## 2015-05-27 NOTE — Patient Instructions (Addendum)
1.   Endometrial biopsy is done today  She will be scheduled for hysteroscopy/D&C with NovaSure endometrial ablation  Within the next month. 2.   Return for preop appointment prior to surgery 3.  Pelvic ultrasound will be performed to rule out any anatomic  Uterine defects.

## 2015-06-01 ENCOUNTER — Ambulatory Visit (INDEPENDENT_AMBULATORY_CARE_PROVIDER_SITE_OTHER): Payer: BC Managed Care – PPO

## 2015-06-01 DIAGNOSIS — N920 Excessive and frequent menstruation with regular cycle: Secondary | ICD-10-CM

## 2015-06-02 LAB — PATHOLOGY

## 2015-06-07 ENCOUNTER — Telehealth: Payer: Self-pay | Admitting: Obstetrics and Gynecology

## 2015-06-07 NOTE — Telephone Encounter (Signed)
PT CALLED AND SHE CAN SEE HER PATHOLOGY RESULTS ON MY CHART AND SHE HAS A LOT OF QUESTIONS AND WANTED TO SEE IF YOU COULD CALL HER BACK AND ANSWER THEM.

## 2015-06-07 NOTE — Telephone Encounter (Signed)
-----   Message from Brayton Mars, MD sent at 06/07/2015  9:38 AM EST ----- Biopsy shows chronic endometritis. Recommend doxycycline 100 mg twice a day for 14 days; #28: No refills; please call and

## 2015-06-07 NOTE — Progress Notes (Signed)
GYN ENCOUNTER NOTE  Subjective:       Susan Sullivan is a 40 y.o. [phone] female is here for gynecologic evaluation of the following issues:  1.Menorrhagia.  The patient is a 40 year old female, para 27, who presents for consultation regarding management planning for menorrhagia.  Contraception is vasectomy..     Gynecologic History Menarche-Early teenage years Cycles monthly-21-28 day intervals Duration of flow 6 days; extremely heavy with clotting. Mild dysmenorrhea; Has used tramadol and ibuprofen No intermenstrual bleeding. No history of endometriosis. No history of deep thrusting dyspareunia Patient's last menstrual period was 05/16/2015 (exact date). Contraception: Vasectomy   Obstetric History OB History  Gravida Para Term Preterm AB SAB TAB Ectopic Multiple Living  8 3 2  5 4  1  3     # Outcome Date GA Lbr Len/2nd Weight Sex Delivery Anes PTL Lv  8 SAB 2016          7 Ectopic 2013          6 SAB 2013          5 SAB 2012          4 Para 2010   9 lb (4.082 kg) M CS-LTranv   Y  3 Term 2001   8 lb 9.6 oz (3.901 kg) F CS-LTranv   Y     Complications: GDM (gestational diabetes mellitus)  2 Term 1999   9 lb (4.082 kg) M Vag-Spont   Y  1 SAB 1999              Past Medical History  Diagnosis Date  . Ectopic pregnancy, tubal Sept 2012    left,  with rupture ,  and concurrent right ovarian cyst rupture  . 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 Complication of anesthesia     ketamin - difficulty waking up  . Dysrhythmia     tachycardia - sees dr. Fletcher Anon  . Gestational diabetes     "only when I'm pregnant" (11/13/2012)  . Degenerative disk disease     not on medication due to attempts to get pregant  . Chronic lower back pain     Past Surgical History  Procedure Laterality Date  . Cesarean section  2002; 2010  . Cholecystectomy  Dec 2012    Jason Dew, [facility]   . Posterior lumbar fusion  11/13/2012  . Augmentation mammaplasty Bilateral 2007  . Laparoscopy for ectopic pregnancy  2012  . Spine surgery  2007    SI joint fusion [employer]  . Spine surgery  June 2014    Earle Gell  L4-L5  . Dilation and curettage of uterus  2012; 2013    x 4    Current Outpatient Prescriptions on File Prior to Visit  Medication Sig Dispense Refill  . acyclovir (ZOVIRAX) 400 MG tablet TAKE 1 TABLET BY MOUTH 3 TIMES A DAY 21 tablet 2  . albuterol (PROAIR HFA) 108 (90 BASE) MCG/ACT inhaler Inhale 2 puffs into the lungs every 6 (six) hours as needed for wheezing. 6.7 g 11  . ALPRAZolam (XANAX) 0.25 MG tablet TAKE ONE TABLET BY MOUTH 2 TIMES A DAY AS NEEDED FOR SLEEP 20 tablet 0  . aspirin 81 MG tablet Take 81 mg by mouth daily.    . cetirizine (ZYRTEC) 10 MG tablet Take 10 mg by mouth daily.    . cyclobenzaprine (FLEXERIL) 10  MG tablet Take 1 tablet (10 mg total) by mouth 3 (three) times daily as needed for muscle spasms. 60 tablet 2  . diazepam (VALIUM) 5 MG tablet Take 1 tablet (5 mg total) by mouth every 6 (six) hours as needed (spasms). 60 tablet 0  . diphenhydrAMINE (BENADRYL) 25 MG tablet Take 25 mg by mouth every 6 (six) hours as needed for itching or allergies.    [name] EPINEPHrine (EPI-PEN) 0.3 mg/0.3 mL SOAJ injection Inject 0.3 mLs (0.3 mg total) into the muscle once. 2 Device 0  . metoprolol tartrate (LOPRESSOR) 25 MG tablet Take 12.5 mg by mouth 2 (two) times daily as needed. For tachycardia    . ondansetron (ZOFRAN ODT) 4 MG disintegrating tablet Take 1 tablet (4 mg total) by mouth every 8 (eight) hours as needed for nausea. 20 tablet 0  . ranitidine (ZANTAC) 300 MG tablet Take 1 tablet (300 mg total) by mouth at bedtime. 90 tablet 3  . traMADol (ULTRAM) 50 MG tablet Take 1 tablet (50 mg total) by mouth every 6 (six) hours as needed. 60 tablet 4   No current facility-administered medications on file prior to visit.    Allergies  Allergen  Reactions  . Clams [Shellfish Allergy] Nausea And Vomiting  . Clindamycin/Lincomycin Anaphylaxis  . Dilaudid [Hydromorphone] Anaphylaxis    Patient worried it caused anaphylaxis  . Gabapentin Other (See Comments)    agitation Hyperacitivity.   . Ketamine Other (See Comments)    Doesn't wake up  . Latex Anaphylaxis, Itching and Rash  . Nabumetone     Hallucinations, felt like she could kill herself  . Other Anaphylaxis    Peanut allergy, silk tape Narcotics-rash, itching; all nuts; peas; lemons (anaphylaxis); melon  . Peanut-Containing Drug Products Anaphylaxis  . Peanuts [Peanut Oil] Anaphylaxis  . Spinach Nausea And Vomiting  . Celebrex [Celecoxib] Other (See Comments)    agitation Insomnia   . Besivance [Besifloxacin Hcl]   . Cymbalta [Duloxetine Hcl] Other (See Comments)    Hyperactivity.   . Eggs Or Egg-Derived Products     Also raw fruits/veggies  . Meat Extract Nausea And Vomiting    Red meat and pork.   . Nubain [Nalbuphine Hcl] Itching  . Propofol Diarrhea and Other (See Comments)    Allergic to Soy Never had issue with prior surgeries receiving this  . Shellfish-Derived Products Other (See Comments)    Positive on RAZ testing  . Soy Allergy Other (See Comments)    GI upset; avoids propofal  . Sulfa Antibiotics   . Hydrocodone Rash  . Sudafed [Pseudoephedrine Hcl] Other (See Comments) and Palpitations    palpatations Tachycardia   . Tape Rash    Social History   Social History  . Marital Status: Married    Spouse Name: N/A  . Number of Children: N/A  . Years of Education: N/A   Occupational History  . Not on file.   Social History Main Topics  . Smoking status: Never Smoker   . Smokeless tobacco: Never Used  . Alcohol Use: No  . Drug Use: No  . Sexual Activity: Yes    Birth Control/ Protection: Other-see comments     Comment: vasectomy   Other Topics Concern  . Not on file   Social History Narrative    Family History  Problem Relation  Age of Onset  . Arthritis Mother   . Hyperlipidemia Mother   . Heart disease Mother   . Diabetes Mother   . Arthritis Father   .  Hyperlipidemia Father   . Heart disease Father 37    CABG   . Diabetes Father   . Stroke Paternal Grandmother   . Heart disease Paternal Grandmother   . Heart disease Paternal Grandfather   . Cancer Neg Hx     The following portions of the patient's history were reviewed and updated as appropriate: allergies, current medications, past family history, past medical history, past social history, past surgical history and problem list.  Review of Systems Review of Systems - General ROS: negative for - chills, fatigue, fever, hot flashes, malaise or night sweats Hematological and Lymphatic ROS: negative for - bleeding problems or swollen lymph nodes Gastrointestinal ROS: negative for - abdominal pain, blood in stools, change in bowel habits and nausea/vomiting Musculoskeletal ROS: negative for - joint pain, muscle pain or muscular weakness Genito-Urinary ROS: negative for - change in menstrual cycle, dysmenorrhea, dyspareunia, dysuria, genital discharge, genital ulcers, hematuria, incontinence nocturia or pelvic pain. POSITIVE-heavy cycles  Objective:   BP 114/77 mmHg  Pulse 105  Ht 5\' 7"  (1.702 m)  Wt 173 lb 6.4 oz (78.654 kg)  BMI 27.15 kg/m2  LMP 05/16/2015 (Exact Date) CONSTITUTIONAL: Well-developed, well-nourished female in no acute distress.  HENT:  Normocephalic, atraumatic.  NECK: Normal range of motion, supple, no masses.  Normal thyroid.  SKIN: Skin is warm and dry. No rash noted. Not diaphoretic. No erythema. No pallor. [place]: Alert and oriented to person, place, and time. PSYCHIATRIC: Normal mood and affect. Normal behavior. Normal judgment and thought content. CARDIOVASCULAR:Not Examined RESPIRATORY: Not Examined BREASTS: Not Examined ABDOMEN: Soft, non distended; Non tender.  No Organomegaly. PELVIC:  External Genitalia: Normal  BUS:  Normal  Vagina: Normal  Cervix: Normal  Uterus: Normal size, shape,consistency, mobile  Adnexa: Normal  RV: Normal   Bladder: Nontender MUSCULOSKELETAL: Normal range of motion. No tenderness.  No cyanosis, clubbing, or edema.     Assessment:   1. MTHFR mutation (methylenetetrahydrofolate reductase) ([place])  2. Menorrhagia with regular cycle - US Pelvis Complete; Future - US Transvaginal Non-OB; Future - Pathology     Plan:   1.  Endometrial biopsy. 2.  Pelvic ultrasound. 3.  Endometrial ablation to be scheduled   Endometrial Biopsy Procedure Note  Pre-operative Diagnosis: Menorrhagia Post-operative Diagnosis: Menorrhagia  Procedure Details   Urine pregnancy test was not done.  The risks (including infection, bleeding, pain, and uterine perforation) and benefits of the procedure were explained to the patient and Verbal informed consent was obtained.  Antibiotic prophylaxis against endocarditis was not indicated.   The patient was placed in the dorsal lithotomy position.  Bimanual exam showed the uterus to be in the neutral position.  A Graves' speculum inserted in the vagina, and the cervix prepped with povidone iodine.  Endocervical curettage with a Kevorkian curette was not performed.   A sharp tenaculum was not applied to the anterior lip of the cervix for stabilization.  A sterile uterine sound was used to sound the uterus to a depth of 8cm.  A Mylex 22mm curette was used to sample the endometrium.  Sample was sent for pathologic examination.  Condition: Stable  Complications: None  Plan:  The patient was advised to call for any fever or for prolonged or severe pain or bleeding. She was advised to use OTC acetaminophen and OTC ibuprofen as needed for mild to moderate pain. She was advised to avoid vaginal intercourse for 48 hours or until the bleeding has completely stopped.  Attending Physician Documentation: Brayton Mars, MD

## 2015-06-07 NOTE — Telephone Encounter (Signed)
Pt wants to know what is causing the endometritis. Has a h/o of retained products. Does have nite chills at least 2-3 a week. Been going on for a long time. Is there any other med that can treat. Pt has a lot of allergies. Doxy she says is a big gun atb. Do you have to start with that one? pls advise.

## 2015-06-08 MED ORDER — DOXYCYCLINE HYCLATE 100 MG PO TABS
100.0000 mg | ORAL_TABLET | Freq: Two times a day (BID) | ORAL | Status: DC
Start: 2015-06-08 — End: 2015-06-16

## 2015-06-08 NOTE — Telephone Encounter (Signed)
Pt aware. Med erx. 

## 2015-06-15 ENCOUNTER — Encounter: Payer: BC Managed Care – PPO | Admitting: Obstetrics and Gynecology

## 2015-06-16 ENCOUNTER — Encounter
Admission: RE | Admit: 2015-06-16 | Discharge: 2015-06-16 | Disposition: A | Discharge status: Home / Self Care | Payer: BC Managed Care – PPO | Source: Ambulatory Visit | Attending: Obstetrics and Gynecology | Admitting: Obstetrics and Gynecology

## 2015-06-16 ENCOUNTER — Encounter: Payer: Self-pay | Admitting: Obstetrics and Gynecology

## 2015-06-16 ENCOUNTER — Ambulatory Visit (INDEPENDENT_AMBULATORY_CARE_PROVIDER_SITE_OTHER): Payer: BC Managed Care – PPO | Admitting: Obstetrics and Gynecology

## 2015-06-16 VITALS — BP 125/86 | Wt 174.3 lb

## 2015-06-16 DIAGNOSIS — Z01812 Encounter for preprocedural laboratory examination: Secondary | ICD-10-CM | POA: Diagnosis present

## 2015-06-16 DIAGNOSIS — N920 Excessive and frequent menstruation with regular cycle: Secondary | ICD-10-CM

## 2015-06-16 DIAGNOSIS — Z8679 Personal history of other diseases of the circulatory system: Secondary | ICD-10-CM | POA: Diagnosis not present

## 2015-06-16 DIAGNOSIS — Z01810 Encounter for preprocedural cardiovascular examination: Secondary | ICD-10-CM | POA: Diagnosis present

## 2015-06-16 DIAGNOSIS — E7212 Methylenetetrahydrofolate reductase deficiency: Secondary | ICD-10-CM

## 2015-06-16 DIAGNOSIS — Z01818 Encounter for other preprocedural examination: Secondary | ICD-10-CM

## 2015-06-16 DIAGNOSIS — Z1589 Genetic susceptibility to other disease: Secondary | ICD-10-CM

## 2015-06-16 LAB — CBC WITH DIFFERENTIAL/PLATELET
BASOS PCT: 1 %
Basophils Absolute: 0.1 10*3/uL (ref 0–0.1)
EOS ABS: 0.1 10*3/uL (ref 0–0.7)
Eosinophils Relative: 2 %
HEMATOCRIT: 38.5 % (ref 35.0–47.0)
Hemoglobin: 12.4 g/dL (ref 12.0–16.0)
LYMPHS ABS: 2 10*3/uL (ref 1.0–3.6)
Lymphocytes Relative: 30 %
MCH: 28.4 pg (ref 26.0–34.0)
MCHC: 32.4 g/dL (ref 32.0–36.0)
MCV: 87.9 fL (ref 80.0–100.0)
MONO ABS: 0.3 10*3/uL (ref 0.2–0.9)
MONOS PCT: 5 %
NEUTROS ABS: 4.2 10*3/uL (ref 1.4–6.5)
Neutrophils Relative %: 62 %
Platelets: 245 10*3/uL (ref 150–440)
RBC: 4.38 MIL/uL (ref 3.80–5.20)
RDW: 14.4 % (ref 11.5–14.5)
WBC: 6.6 10*3/uL (ref 3.6–11.0)

## 2015-06-16 LAB — SURGICAL PCR SCREEN
MRSA, PCR: POSITIVE — AB
Staphylococcus aureus: POSITIVE — AB

## 2015-06-16 LAB — RAPID HIV SCREEN (HIV 1/2 AB+AG)
HIV 1/2 ANTIBODIES: NONREACTIVE
HIV-1 P24 Antigen - HIV24: NONREACTIVE

## 2015-06-16 NOTE — Progress Notes (Signed)
Subjective:    Patient is a 40 y.o. [id number] scheduled for hysteroscopy/D&C with NovaSure endometrial ablation. Indications for procedure are menorrhagia. Preoperative endometrial biopsy demonstrated chronic endometritis, which has been treated with doxycycline antibiotics.   Pertinent Gynecological History:  Menses: flow is excessive with use of multiple pads or tampons on heaviest days Bleeding: cyclic Contraception: vasectomy   Menstrual History: OB History    Gravida Para Term Preterm AB TAB SAB Ectopic Multiple Living   8 3 2  5  4 1  3       Menarche age: Not applicable  Patient's last menstrual period was 05/16/2015 (exact date).    Past Medical History  Diagnosis Date  . Ectopic pregnancy, tubal Sept 2012    left,  with rupture ,  and concurrent right ovarian cyst rupture  . 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 Complication of anesthesia     ketamin - difficulty waking up  . Dysrhythmia     tachycardia - sees dr. Fletcher Anon  . Gestational diabetes     "only when I'm pregnant" (11/13/2012)  . Degenerative disk disease     not on medication due to attempts to get pregant  . Chronic lower back pain     Past Surgical History  Procedure Laterality Date  . Cesarean section  2002; 2010  . Cholecystectomy  Dec 2012    Jason Dew, [facility]  . Posterior lumbar fusion  11/13/2012  . Augmentation mammaplasty Bilateral 2007  . Laparoscopy for ectopic pregnancy  2012  . Spine surgery  2007    SI joint fusion [employer]  . Spine surgery  June 2014    Earle Gell  L4-L5  . Dilation and curettage of uterus  2012; 2013    x 4    OB History  Gravida Para Term Preterm AB SAB TAB Ectopic Multiple Living  8 3 2  5 4  1  3     # Outcome Date GA Lbr Len/2nd Weight Sex Delivery Anes PTL Lv  8 SAB 2016          7 Ectopic 2013          6 SAB 2013          5 SAB 2012          4  Para 2010   9 lb (4.082 kg) M CS-LTranv   Y  3 Term 2001   8 lb 9.6 oz (3.901 kg) F CS-LTranv   Y     Complications: GDM (gestational diabetes mellitus)  2 Term 1999   9 lb (4.082 kg) M Vag-Spont   Y  1 SAB 1999              Social History   Social History  . Marital Status: Married    Spouse Name: N/A  . Number of Children: N/A  . Years of Education: N/A   Social History Main Topics  . Smoking status: Never Smoker   . Smokeless tobacco: Never Used  . Alcohol Use: No  . Drug Use: No  . Sexual Activity: Yes    Birth Control/ Protection: Other-see comments     Comment: vasectomy   Other Topics Concern  . Not on file   Social History Narrative    Family History  Problem Relation Age of Onset  . Arthritis Mother   . Hyperlipidemia Mother   .  Heart disease Mother   . Diabetes Mother   . Arthritis Father   . Hyperlipidemia Father   . Heart disease Father 59    CABG   . Diabetes Father   . Stroke Paternal Grandmother   . Heart disease Paternal Grandmother   . Heart disease Paternal Grandfather   . Cancer Neg Hx      (Not in a hospital admission)  Allergies  Allergen Reactions  . Clams [Shellfish Allergy] Nausea And Vomiting  . Clindamycin/Lincomycin Anaphylaxis  . Dilaudid [Hydromorphone] Anaphylaxis    Patient worried it caused anaphylaxis  . Gabapentin Other (See Comments)    agitation Hyperacitivity.   . Ketamine Other (See Comments)    Doesn't wake up  . Latex Anaphylaxis, Itching and Rash  . Nabumetone     Hallucinations, felt like she could kill herself  . Other Anaphylaxis    Peanut allergy, silk tape Narcotics-rash, itching; all nuts; peas; lemons (anaphylaxis); melon  . Peanut-Containing Drug Products Anaphylaxis  . Peanuts [Peanut Oil] Anaphylaxis  . Spinach Nausea And Vomiting  . Celebrex [Celecoxib] Other (See Comments)    agitation Insomnia   . Besivance [Besifloxacin Hcl]   . Cymbalta [Duloxetine Hcl] Other (See Comments)     Hyperactivity.   . Eggs Or Egg-Derived Products     Also raw fruits/veggies  . Meat Extract Nausea And Vomiting    Red meat and pork.   . Nubain [Nalbuphine Hcl] Itching  . Propofol Diarrhea and Other (See Comments)    Allergic to Soy Never had issue with prior surgeries receiving this  . Shellfish-Derived Products Other (See Comments)    Positive on RAZ testing  . Soy Allergy Other (See Comments)    GI upset; avoids propofal  . Sulfa Antibiotics   . Hydrocodone Rash  . Sudafed [Pseudoephedrine Hcl] Other (See Comments) and Palpitations    palpatations Tachycardia   . Tape Rash    Review of Systems Constitutional: No recent fever/chills/sweats Respiratory: No recent cough/bronchitis Cardiovascular: No chest pain Gastrointestinal: No recent nausea/vomiting/diarrhea Genitourinary: No UTI symptoms Hematologic/lymphatic:No history of coagulopathy or recent blood thinner use    Objective:    LMP 05/16/2015 (Exact Date)  General:   Normal  Skin:   normal  HEENT:  Normal  Neck:  Supple without Adenopathy or Thyromegaly  Lungs:   Heart:              Breasts:   Abdomen:  Pelvis:  M/S   Extremeties:  Neuro:    clear to auscultation bilaterally   Normal without murmur   Not Examined   soft, non-tender; bowel sounds normal; no masses,  no organomegaly   Exam deferred to OR  No CVAT  Warm/Dry   Normal          Assessment:     1. menorrhagia  2.  MTH FR mutation 3.  Iodine, peanut allergy  Plan:   Hysteroscopy/D&C with NovaSure endometrial ablation.   Preoperative counseling: The patient is to undergo hysteroscopy/D&C with NovaSure endometrial ablation for menorrhagia on 06/21/2015.  She is understanding of the planned procedure and is aware of and is accepting of all surgical risks which include but are not limited to bleeding, infection, pelvic organ injury with need for repair, looked like disorders, anesthesia risks, etc.  All questions have been answered.   Informed consent is given.  Patient is ready and willing to proceed with surgery as scheduled.  Brayton Mars, MD  Note: This dictation was prepared with  Dragon dictation along with smaller Company secretary. Any transcriptional errors that result from this process are unintentional.

## 2015-06-16 NOTE — Pre-Procedure Instructions (Signed)
Spoke with Robin at Dr. Thamas Jaegers office, yes they did receive the fax on the MRSA + Stap Aureus results and she will take it to [facility].

## 2015-06-16 NOTE — Patient Instructions (Signed)
1.  Return in 1 week for a postop check.  Date of surgery is 06/21/2015

## 2015-06-16 NOTE — H&P (Signed)
Subjective:  PREOPERATIVE HISTORY AND PHYSICAL    Patient is a 40 y.o. G8P2090female scheduled for hysteroscopy/D&C with NovaSure endometrial ablation. Indications for procedure are menorrhagia. Preoperative endometrial biopsy demonstrated chronic endometritis, which has been treated with doxycycline antibiotics.   Pertinent Gynecological History:  Menses: flow is excessive with use of multiple pads or tampons on heaviest days Bleeding: cyclic Contraception: vasectomy   Menstrual History: OB History    Gravida Para Term Preterm AB TAB SAB Ectopic Multiple Living   8 3 2  5  4 1  3       Menarche age: Not applicable  Patient's last menstrual period was 05/16/2015 (exact date).    Past Medical History  Diagnosis Date  . Ectopic pregnancy, tubal Sept 2012    left,  with rupture ,  and concurrent right ovarian cyst rupture  . 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 Complication of anesthesia     ketamin - difficulty waking up  . Dysrhythmia     tachycardia - sees dr. Fletcher Anon  . Gestational diabetes     "only when I'm pregnant" (11/13/2012)  . Degenerative disk disease     not on medication due to attempts to get pregant  . Chronic lower back pain     Past Surgical History  Procedure Laterality Date  . Cesarean section  2002; 2010  . Cholecystectomy  Dec 2012    Jason Dew, [facility]  . Posterior lumbar fusion  11/13/2012  . Augmentation mammaplasty Bilateral 2007  . Laparoscopy for ectopic pregnancy  2012  . Spine surgery  2007    SI joint fusion [employer]  . Spine surgery  June 2014    Earle Gell  L4-L5  . Dilation and curettage of uterus  2012; 2013    x 4    OB History  Gravida Para Term Preterm AB SAB TAB Ectopic Multiple Living  8 3 2  5 4  1  3     # Outcome Date GA Lbr Len/2nd Weight Sex Delivery Anes PTL Lv  8 SAB 2016          7 Ectopic 2013          6 SAB  2013          5 SAB 2012          4 Para 2010   9 lb (4.082 kg) M CS-LTranv   Y  3 Term 2001   8 lb 9.6 oz (3.901 kg) F CS-LTranv   Y     Complications: GDM (gestational diabetes mellitus)  2 Term 1999   9 lb (4.082 kg) M Vag-Spont   Y  1 SAB 1999              Social History   Social History  . Marital Status: Married    Spouse Name: N/A  . Number of Children: N/A  . Years of Education: N/A   Social History Main Topics  . Smoking status: Never Smoker   . Smokeless tobacco: Never Used  . Alcohol Use: No  . Drug Use: No  . Sexual Activity: Yes    Birth Control/ Protection: Other-see comments     Comment: vasectomy   Other Topics Concern  . Not on file   Social History Narrative    Family History  Problem Relation Age of Onset  . Arthritis Mother   .  Hyperlipidemia Mother   . Heart disease Mother   . Diabetes Mother   . Arthritis Father   . Hyperlipidemia Father   . Heart disease Father 30    CABG   . Diabetes Father   . Stroke Paternal Grandmother   . Heart disease Paternal Grandmother   . Heart disease Paternal Grandfather   . Cancer Neg Hx      (Not in a hospital admission)  Allergies  Allergen Reactions  . Clams [Shellfish Allergy] Nausea And Vomiting  . Clindamycin/Lincomycin Anaphylaxis  . Dilaudid [Hydromorphone] Anaphylaxis    Patient worried it caused anaphylaxis  . Gabapentin Other (See Comments)    agitation Hyperacitivity.   . Ketamine Other (See Comments)    Doesn't wake up  . Latex Anaphylaxis, Itching and Rash  . Nabumetone     Hallucinations, felt like she could kill herself  . Other Anaphylaxis    Peanut allergy, silk tape Narcotics-rash, itching; all nuts; peas; lemons (anaphylaxis); melon  . Peanut-Containing Drug Products Anaphylaxis  . Peanuts [Peanut Oil] Anaphylaxis  . Spinach Nausea And Vomiting  . Celebrex [Celecoxib] Other (See Comments)    agitation Insomnia   . Besivance [Besifloxacin Hcl]   . Cymbalta [Duloxetine  Hcl] Other (See Comments)    Hyperactivity.   . Eggs Or Egg-Derived Products     Also raw fruits/veggies  . Meat Extract Nausea And Vomiting    Red meat and pork.   . Nubain [Nalbuphine Hcl] Itching  . Propofol Diarrhea and Other (See Comments)    Allergic to Soy Never had issue with prior surgeries receiving this  . Shellfish-Derived Products Other (See Comments)    Positive on RAZ testing  . Soy Allergy Other (See Comments)    GI upset; avoids propofal  . Sulfa Antibiotics   . Hydrocodone Rash  . Sudafed [Pseudoephedrine Hcl] Other (See Comments) and Palpitations    palpatations Tachycardia   . Tape Rash    Review of Systems Constitutional: No recent fever/chills/sweats Respiratory: No recent cough/bronchitis Cardiovascular: No chest pain Gastrointestinal: No recent nausea/vomiting/diarrhea Genitourinary: No UTI symptoms Hematologic/lymphatic:No history of coagulopathy or recent blood thinner use    Objective:    LMP 05/16/2015 (Exact Date)  General:   Normal  Skin:   normal  HEENT:  Normal  Neck:  Supple without Adenopathy or Thyromegaly  Lungs:   Heart:              Breasts:   Abdomen:  Pelvis:  M/S   Extremeties:  Neuro:    clear to auscultation bilaterally   Normal without murmur   Not Examined   soft, non-tender; bowel sounds normal; no masses,  no organomegaly   Exam deferred to OR  No CVAT  Warm/Dry   Normal          Assessment:     1. menorrhagia  2.  MTH FR mutation 3.  Iodine, peanut allergy  Plan:   Hysteroscopy/D&C with NovaSure endometrial ablation.   Preoperative counseling: The patient is to undergo hysteroscopy/D&C with NovaSure endometrial ablation for menorrhagia on 06/21/2015.  She is understanding of the planned procedure and is aware of and is accepting of all surgical risks which include but are not limited to bleeding, infection, pelvic organ injury with need for repair, looked like disorders, anesthesia risks, etc.  All  questions have been answered.  Informed consent is given.  Patient is ready and willing to proceed with surgery as scheduled.  Brayton Mars, MD  Note:  This dictation was prepared with Dragon dictation along with smaller phrase technology. Any transcriptional errors that result from this process are unintentional.

## 2015-06-16 NOTE — Patient Instructions (Addendum)
  Your procedure is scheduled on: Monday Jun 21, 2015. Report to Same Day Surgery. To find out your arrival time please call [phone] between 1PM - 3PM on Friday Jun 18, 2015  Remember: Instructions that are not followed completely may result in serious medical risk, up to and including death, or upon the discretion of your surgeon and anesthesiologist your surgery may need to be rescheduled.    _x___ 1. Do not eat food or drink liquids after midnight. No gum chewing or hard candies.     ____ 2. No Alcohol for 24 hours before or after surgery.   ____ 3. Bring all medications with you on the day of surgery if instructed.    __x__ 4. Notify your doctor if there is any change in your medical condition     (cold, fever, infections).     Do not wear jewelry, make-up, hairpins, clips or nail polish.  Do not wear lotions, powders, or perfumes. You may wear deodorant.  Do not shave 48 hours prior to surgery. Men may shave face and neck.  Do not bring valuables to the hospital.    [facility] is not responsible for any belongings or valuables.               Contacts, dentures or bridgework may not be worn into surgery.  Leave your suitcase in the car. After surgery it may be brought to your room.  For patients admitted to the hospital, discharge time is determined by your treatment team.   Patients discharged the day of surgery will not be allowed to drive home.    Please read over the following fact sheets that you were given:   [facility] Preparing for Surgery  _x___ Take these medicines the morning of surgery with A SIP OF WATER:    1. ranitidine (ZANTAC) and at bedtime 06/20/15  2. Benadryl  3. Singular    ____ Fleet Enema (as directed)   ____ Use CHG Soap as directed  _x___ Use inhalers on the day of surgery and bring inhaler to hospital.  ____ Stop metformin 2 days prior to surgery    ____ Take 1/2 of usual insulin dose the night before surgery and none on the morning  of surgery.   ____ Already stopped aspirin.  _x___ Stop Anti-inflammatories now.  OK to take Tramadol or Tylenol.   _x___ Stop supplements Vitamin B until after surgery. Continue D3 vitamin. Stop the phentermine on Sunday if possible.   ____ Bring C-Pap to the hospital.

## 2015-06-16 NOTE — Pre-Procedure Instructions (Signed)
Faxed MRSA and Staph Aureus positive results to Dr. Enzo Bi for eval and treatment as indicated.

## 2015-06-17 ENCOUNTER — Telehealth: Payer: Self-pay | Admitting: Obstetrics and Gynecology

## 2015-06-17 LAB — RPR: RPR: NONREACTIVE

## 2015-06-17 MED ORDER — MUPIROCIN 2 % EX OINT: 1.0000 "application " | TOPICAL_OINTMENT | Freq: Two times a day (BID) | CUTANEOUS | Status: DC

## 2015-06-17 MED ORDER — DOXYCYCLINE HYCLATE 100 MG PO CAPS: 100.0000 mg | ORAL_CAPSULE | Freq: Two times a day (BID) | ORAL | Status: DC

## 2015-06-17 MED ORDER — FLUCONAZOLE 150 MG PO TABS: 150.0000 mg | ORAL_TABLET | Freq: Every day | ORAL | Status: DC

## 2015-06-17 NOTE — Telephone Encounter (Signed)
-----   Message from Brayton Mars, MD sent at 06/17/2015  7:55 AM EST ----- Please notify - Abnormal Labs Please: Mupirocin ointment;Applied to each nostril twice a day for 5 days

## 2015-06-17 NOTE — Telephone Encounter (Signed)
Patient called stating she wasn't sure if she was supposed to take the doxycycline for 7 days or 14. The pharmacy only gave her enough for 7 days. Also the patient states she has developed a yeast infection.Please Advise

## 2015-06-17 NOTE — Telephone Encounter (Signed)
Pt aware. Med erx. Sent in doxycycline last week for only 7 days but pt needed 14 days. Sent in another 7 days.

## 2015-06-18 NOTE — Pre-Procedure Instructions (Signed)
Refaxed info about positive MRSA and staph aureus.  Questioned if wants any antibiotics?

## 2015-06-21 ENCOUNTER — Ambulatory Visit: Payer: BC Managed Care – PPO | Admitting: Registered Nurse

## 2015-06-21 ENCOUNTER — Ambulatory Visit
Admission: RE | Admit: 2015-06-21 | Discharge: 2015-06-21 | Disposition: A | Discharge status: Home / Self Care | Payer: BC Managed Care – PPO | Source: Ambulatory Visit | Attending: Obstetrics and Gynecology | Admitting: Obstetrics and Gynecology

## 2015-06-21 ENCOUNTER — Encounter
Admission: RE | Disposition: A | Discharge status: Home / Self Care | Payer: Self-pay | Source: Ambulatory Visit | Attending: Obstetrics and Gynecology

## 2015-06-21 DIAGNOSIS — Z833 Family history of diabetes mellitus: Secondary | ICD-10-CM | POA: Diagnosis not present

## 2015-06-21 DIAGNOSIS — Z823 Family history of stroke: Secondary | ICD-10-CM | POA: Diagnosis not present

## 2015-06-21 DIAGNOSIS — Z885 Allergy status to narcotic agent status: Secondary | ICD-10-CM | POA: Insufficient documentation

## 2015-06-21 DIAGNOSIS — N920 Excessive and frequent menstruation with regular cycle: Secondary | ICD-10-CM | POA: Diagnosis not present

## 2015-06-21 DIAGNOSIS — M545 Low back pain: Secondary | ICD-10-CM | POA: Insufficient documentation

## 2015-06-21 DIAGNOSIS — Z91013 Allergy to seafood: Secondary | ICD-10-CM | POA: Insufficient documentation

## 2015-06-21 DIAGNOSIS — Z91018 Allergy to other foods: Secondary | ICD-10-CM | POA: Diagnosis not present

## 2015-06-21 DIAGNOSIS — Z91010 Allergy to peanuts: Secondary | ICD-10-CM | POA: Insufficient documentation

## 2015-06-21 DIAGNOSIS — I499 Cardiac arrhythmia, unspecified: Secondary | ICD-10-CM | POA: Insufficient documentation

## 2015-06-21 DIAGNOSIS — Z882 Allergy status to sulfonamides status: Secondary | ICD-10-CM | POA: Insufficient documentation

## 2015-06-21 DIAGNOSIS — Z888 Allergy status to other drugs, medicaments and biological substances status: Secondary | ICD-10-CM | POA: Diagnosis not present

## 2015-06-21 DIAGNOSIS — Z8632 Personal history of gestational diabetes: Secondary | ICD-10-CM | POA: Diagnosis not present

## 2015-06-21 DIAGNOSIS — Z8249 Family history of ischemic heart disease and other diseases of the circulatory system: Secondary | ICD-10-CM | POA: Insufficient documentation

## 2015-06-21 DIAGNOSIS — Z9049 Acquired absence of other specified parts of digestive tract: Secondary | ICD-10-CM | POA: Diagnosis not present

## 2015-06-21 DIAGNOSIS — G8929 Other chronic pain: Secondary | ICD-10-CM | POA: Diagnosis not present

## 2015-06-21 DIAGNOSIS — Z91040 Latex allergy status: Secondary | ICD-10-CM | POA: Insufficient documentation

## 2015-06-21 DIAGNOSIS — Z91041 Radiographic dye allergy status: Secondary | ICD-10-CM | POA: Insufficient documentation

## 2015-06-21 DIAGNOSIS — N939 Abnormal uterine and vaginal bleeding, unspecified: Secondary | ICD-10-CM

## 2015-06-21 DIAGNOSIS — Z91012 Allergy to eggs: Secondary | ICD-10-CM | POA: Insufficient documentation

## 2015-06-21 DIAGNOSIS — Z91048 Other nonmedicinal substance allergy status: Secondary | ICD-10-CM | POA: Diagnosis not present

## 2015-06-21 DIAGNOSIS — Z8261 Family history of arthritis: Secondary | ICD-10-CM | POA: Diagnosis not present

## 2015-06-21 DIAGNOSIS — N719 Inflammatory disease of uterus, unspecified: Secondary | ICD-10-CM | POA: Insufficient documentation

## 2015-06-21 DIAGNOSIS — E7212 Methylenetetrahydrofolate reductase deficiency: Secondary | ICD-10-CM

## 2015-06-21 HISTORY — PX: DILITATION & CURRETTAGE/HYSTROSCOPY WITH NOVASURE ABLATION: SHX5568

## 2015-06-21 LAB — POCT PREGNANCY, URINE: PREG TEST UR: NEGATIVE

## 2015-06-21 SURGERY — DILATATION & CURETTAGE/HYSTEROSCOPY WITH NOVASURE ABLATION
Anesthesia: General | Site: Cervix | Wound class: Clean Contaminated

## 2015-06-21 MED ORDER — MIDAZOLAM HCL 2 MG/2ML IJ SOLN
INTRAMUSCULAR | Status: DC | PRN
  Administered 2015-06-21: 2 mg via INTRAVENOUS

## 2015-06-21 MED ORDER — ONDANSETRON HCL 4 MG/2ML IJ SOLN
4.0000 mg | Freq: Once | INTRAMUSCULAR | Status: DC | PRN
Start: 2015-06-21 — End: 2015-06-21

## 2015-06-21 MED ORDER — ESMOLOL HCL 100 MG/10ML IV SOLN
INTRAVENOUS | Status: DC | PRN
  Administered 2015-06-21: 30 mg via INTRAVENOUS
  Administered 2015-06-21: 20 mg via INTRAVENOUS

## 2015-06-21 MED ORDER — GLYCOPYRROLATE 0.2 MG/ML IJ SOLN
INTRAMUSCULAR | Status: DC | PRN
  Administered 2015-06-21: 0.2 mg via INTRAVENOUS

## 2015-06-21 MED ORDER — LIDOCAINE-EPINEPHRINE 1 %-1:100000 IJ SOLN
INTRAMUSCULAR | Status: DC | PRN
  Administered 2015-06-21: 20 mL

## 2015-06-21 MED ORDER — LIDOCAINE-EPINEPHRINE 1 %-1:100000 IJ SOLN
INTRAMUSCULAR | Status: AC
Start: 2015-06-21 — End: 2015-06-21
  Filled 2015-06-21: qty 1

## 2015-06-21 MED ORDER — FENTANYL CITRATE (PF) 100 MCG/2ML IJ SOLN
INTRAMUSCULAR | Status: DC | PRN
  Administered 2015-06-21 (×2): 50 ug via INTRAVENOUS

## 2015-06-21 MED ORDER — PROPOFOL 500 MG/50ML IV EMUL
INTRAVENOUS | Status: DC | PRN
  Administered 2015-06-21: 180 ug/kg/min via INTRAVENOUS

## 2015-06-21 MED ORDER — ONDANSETRON HCL 4 MG/2ML IJ SOLN
INTRAMUSCULAR | Status: DC | PRN
Start: 2015-06-21 — End: 2015-06-21
  Administered 2015-06-21: 4 mg via INTRAVENOUS

## 2015-06-21 MED ORDER — PROPOFOL 10 MG/ML IV BOLUS
INTRAVENOUS | Status: DC | PRN
  Administered 2015-06-21: 180 mg via INTRAVENOUS

## 2015-06-21 MED ORDER — FENTANYL CITRATE (PF) 100 MCG/2ML IJ SOLN: 25.0000 ug | INTRAMUSCULAR | Status: DC | PRN

## 2015-06-21 MED ORDER — LACTATED RINGERS IV SOLN: INTRAVENOUS | Status: DC

## 2015-06-21 MED ORDER — LIDOCAINE HCL (CARDIAC) 20 MG/ML IV SOLN
INTRAVENOUS | Status: DC | PRN
  Administered 2015-06-21: 100 mg via INTRAVENOUS

## 2015-06-21 MED ORDER — LACTATED RINGERS IV SOLN
INTRAVENOUS | Status: DC
  Administered 2015-06-21: 11:00:00 via INTRAVENOUS

## 2015-06-21 SURGICAL SUPPLY — 15 items
CATH ROBINSON RED A/P 16FR (CATHETERS) ×2 IMPLANT
CUP MEDICINE 2OZ PLAST GRAD ST (MISCELLANEOUS) ×2 IMPLANT
GLOVE BIO SURGEON STRL SZ8 (GLOVE) ×2 IMPLANT
GOWN STRL REUS W/ TWL LRG LVL3 (GOWN DISPOSABLE) ×1 IMPLANT
GOWN STRL REUS W/ TWL XL LVL3 (GOWN DISPOSABLE) ×1 IMPLANT
GOWN STRL REUS W/TWL LRG LVL3 (GOWN DISPOSABLE) ×1
GOWN STRL REUS W/TWL XL LVL3 (GOWN DISPOSABLE) ×1
IV LACTATED RINGERS 1000ML (IV SOLUTION) ×2 IMPLANT
KIT RM TURNOVER CYSTO AR (KITS) ×2 IMPLANT
NEEDLE SPNL 20GX3.5 QUINCKE YW (NEEDLE) ×2 IMPLANT
NOVASURE ENDOMETRIAL ABLATION (MISCELLANEOUS) ×2 IMPLANT
PACK DNC HYST (MISCELLANEOUS) ×2 IMPLANT
PAD OB MATERNITY 4.3X12.25 (PERSONAL CARE ITEMS) ×2 IMPLANT
PAD PREP 24X41 OB/GYN DISP (PERSONAL CARE ITEMS) ×2 IMPLANT
SYRINGE 10CC LL (SYRINGE) ×2 IMPLANT

## 2015-06-21 NOTE — H&P (View-Only) (Signed)
Subjective:  PREOPERATIVE HISTORY AND PHYSICAL    Patient is a 40 y.o. G8P2017female scheduled for hysteroscopy/D&C with NovaSure endometrial ablation. Indications for procedure are menorrhagia. Preoperative endometrial biopsy demonstrated chronic endometritis, which has been treated with doxycycline antibiotics.   Pertinent Gynecological History:  Menses: flow is excessive with use of multiple pads or tampons on heaviest days Bleeding: cyclic Contraception: vasectomy   Menstrual History: OB History    Gravida Para Term Preterm AB TAB SAB Ectopic Multiple Living   8 3 2  5  4 1  3       Menarche age: Not applicable  Patient's last menstrual period was 05/16/2015 (exact date).    Past Medical History  Diagnosis Date  . Ectopic pregnancy, tubal Sept 2012    left,  with rupture ,  and concurrent right ovarian cyst rupture  . 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 Complication of anesthesia     ketamin - difficulty waking up  . Dysrhythmia     tachycardia - sees dr. Fletcher Anon  . Gestational diabetes     "only when I'm pregnant" (11/13/2012)  . Degenerative disk disease     not on medication due to attempts to get pregant  . Chronic lower back pain     Past Surgical History  Procedure Laterality Date  . Cesarean section  2002; 2010  . Cholecystectomy  Dec 2012    Jason Dew, [facility]  . Posterior lumbar fusion  11/13/2012  . Augmentation mammaplasty Bilateral 2007  . Laparoscopy for ectopic pregnancy  2012  . Spine surgery  2007    SI joint fusion [employer]  . Spine surgery  June 2014    Earle Gell  L4-L5  . Dilation and curettage of uterus  2012; 2013    x 4    OB History  Gravida Para Term Preterm AB SAB TAB Ectopic Multiple Living  8 3 2  5 4  1  3     # Outcome Date GA Lbr Len/2nd Weight Sex Delivery Anes PTL Lv  8 SAB 2016          7 Ectopic 2013          6 SAB  2013          5 SAB 2012          4 Para 2010   9 lb (4.082 kg) M CS-LTranv   Y  3 Term 2001   8 lb 9.6 oz (3.901 kg) F CS-LTranv   Y     Complications: GDM (gestational diabetes mellitus)  2 Term 1999   9 lb (4.082 kg) M Vag-Spont   Y  1 SAB 1999              Social History   Social History  . Marital Status: Married    Spouse Name: N/A  . Number of Children: N/A  . Years of Education: N/A   Social History Main Topics  . Smoking status: Never Smoker   . Smokeless tobacco: Never Used  . Alcohol Use: No  . Drug Use: No  . Sexual Activity: Yes    Birth Control/ Protection: Other-see comments     Comment: vasectomy   Other Topics Concern  . Not on file   Social History Narrative    Family History  Problem Relation Age of Onset  . Arthritis Mother   .  Hyperlipidemia Mother   . Heart disease Mother   . Diabetes Mother   . Arthritis Father   . Hyperlipidemia Father   . Heart disease Father 66    CABG   . Diabetes Father   . Stroke Paternal Grandmother   . Heart disease Paternal Grandmother   . Heart disease Paternal Grandfather   . Cancer Neg Hx      (Not in a hospital admission)  Allergies  Allergen Reactions  . Clams [Shellfish Allergy] Nausea And Vomiting  . Clindamycin/Lincomycin Anaphylaxis  . Dilaudid [Hydromorphone] Anaphylaxis    Patient worried it caused anaphylaxis  . Gabapentin Other (See Comments)    agitation Hyperacitivity.   . Ketamine Other (See Comments)    Doesn't wake up  . Latex Anaphylaxis, Itching and Rash  . Nabumetone     Hallucinations, felt like she could kill herself  . Other Anaphylaxis    Peanut allergy, silk tape Narcotics-rash, itching; all nuts; peas; lemons (anaphylaxis); melon  . Peanut-Containing Drug Products Anaphylaxis  . Peanuts [Peanut Oil] Anaphylaxis  . Spinach Nausea And Vomiting  . Celebrex [Celecoxib] Other (See Comments)    agitation Insomnia   . Besivance [Besifloxacin Hcl]   . Cymbalta [Duloxetine  Hcl] Other (See Comments)    Hyperactivity.   . Eggs Or Egg-Derived Products     Also raw fruits/veggies  . Meat Extract Nausea And Vomiting    Red meat and pork.   . Nubain [Nalbuphine Hcl] Itching  . Propofol Diarrhea and Other (See Comments)    Allergic to Soy Never had issue with prior surgeries receiving this  . Shellfish-Derived Products Other (See Comments)    Positive on RAZ testing  . Soy Allergy Other (See Comments)    GI upset; avoids propofal  . Sulfa Antibiotics   . Hydrocodone Rash  . Sudafed [Pseudoephedrine Hcl] Other (See Comments) and Palpitations    palpatations Tachycardia   . Tape Rash    Review of Systems Constitutional: No recent fever/chills/sweats Respiratory: No recent cough/bronchitis Cardiovascular: No chest pain Gastrointestinal: No recent nausea/vomiting/diarrhea Genitourinary: No UTI symptoms Hematologic/lymphatic:No history of coagulopathy or recent blood thinner use    Objective:    LMP 05/16/2015 (Exact Date)  General:   Normal  Skin:   normal  HEENT:  Normal  Neck:  Supple without Adenopathy or Thyromegaly  Lungs:   Heart:              Breasts:   Abdomen:  Pelvis:  M/S   Extremeties:  Neuro:    clear to auscultation bilaterally   Normal without murmur   Not Examined   soft, non-tender; bowel sounds normal; no masses,  no organomegaly   Exam deferred to OR  No CVAT  Warm/Dry   Normal          Assessment:     1. menorrhagia  2.  MTH FR mutation 3.  Iodine, peanut allergy  Plan:   Hysteroscopy/D&C with NovaSure endometrial ablation.   Preoperative counseling: The patient is to undergo hysteroscopy/D&C with NovaSure endometrial ablation for menorrhagia on 06/21/2015.  She is understanding of the planned procedure and is aware of and is accepting of all surgical risks which include but are not limited to bleeding, infection, pelvic organ injury with need for repair, looked like disorders, anesthesia risks, etc.  All  questions have been answered.  Informed consent is given.  Patient is ready and willing to proceed with surgery as scheduled.  Brayton Mars, MD  Note:  This dictation was prepared with Dragon dictation along with smaller phrase technology. Any transcriptional errors that result from this process are unintentional.

## 2015-06-21 NOTE — Anesthesia Preprocedure Evaluation (Signed)
Anesthesia Evaluation  Patient identified by MRN, date of birth, ID band Patient awake    Reviewed: Allergy & Precautions, NPO status , Patient's Chart, lab work & pertinent test results  History of Anesthesia Complications (+) PONV, PROLONGED EMERGENCE and history of anesthetic complications  Airway Mallampati: II  TM Distance: >3 FB     Dental  (+) Chipped   Pulmonary  Reactive airway   Pulmonary exam normal breath sounds clear to auscultation       Cardiovascular Normal cardiovascular exam+ dysrhythmias   ?SVT   Neuro/Psych Anxiety negative neurological ROS     GI/Hepatic negative GI ROS, Neg liver ROS,   Endo/Other  diabetes, Well Controlled, Gestational  Renal/GU negative Renal ROS     Musculoskeletal  (+) Arthritis , Osteoarthritis,    Abdominal Normal abdominal exam  (+)   Peds negative pediatric ROS (+)  Hematology   Anesthesia Other Findings Does well with diprivan, fentanyl and versed... But anesthetic gases take a long time to wake up  Reproductive/Obstetrics                             Anesthesia Physical Anesthesia Plan  ASA: II  Anesthesia Plan: General   Post-op Pain Management:    Induction:   Airway Management Planned: LMA  Additional Equipment:   Intra-op Plan:   Post-operative Plan: Extubation in OR  Informed Consent: I have reviewed the patients History and Physical, chart, labs and discussed the procedure including the risks, benefits and alternatives for the proposed anesthesia with the patient or authorized representative who has indicated his/her understanding and acceptance.   Dental advisory given  Plan Discussed with: CRNA and Surgeon  Anesthesia Plan Comments:         Anesthesia Quick Evaluation

## 2015-06-21 NOTE — Interval H&P Note (Signed)
History and Physical Interval Note:  06/21/2015 11:39 AM  Lyman  has presented today for surgery, with the diagnosis of menorrhagia  The various methods of treatment have been discussed with the patient and family. After consideration of risks, benefits and other options for treatment, the patient has consented to  Procedure(s): [place] (N/A) as a surgical intervention .  The patient's history has been reviewed, patient examined, no change in status, stable for surgery.  I have reviewed the patient's chart and labs.  Questions were answered to the patient's satisfaction.     Hassell Done A Tremane Spurgeon

## 2015-06-21 NOTE — Discharge Instructions (Signed)

## 2015-06-21 NOTE — OR Nursing (Signed)
Patient refused TED hose

## 2015-06-21 NOTE — Op Note (Signed)
OPERATIVE NOTE  Date of surgery: 06/21/2015  Preoperative diagnosis:  1. Abnormal uterine bleeding Postoperative diagnosis:  1. Abnormal uterine bleeding  Operative procedure: 1. Hysteroscopy 2. Dilation and curettage of endometrium 3. NovaSure endometrial ablation  Surgeon: Dr. Zipporah Plants Assistant: Alyse Low Anesthesia: IV sedation; paracervical   Findings: Pelvis: Gynecoid pelvis; good uterine descensus with tenaculum traction; patient is an excellent TVH candidate if necessary Uterus: sounded to 9.5 cm Normal appearing endometrial cavity without gross lesions.  Description of procedure Patient was brought to the operating room where she was placed in supine position. IV sedation was induced without difficulty using the LMA technique. The patient was placed in dorsal lithotomy position using candy cane stirrups. A Hibiclens perineal intravaginal prep and drape was performed in standard fashion. Weighted speculum was placed in the vagina. Single-tooth tenaculum was placed on the anterior cervix. Uterus was sounded to 9.5 cm. Hanks dilators were used up to a # 8 [place] caliber to dilate the endocervical canal. The [facility] hysteroscope using lactated Ringer's as irrigant was used for the hysteroscopy. The above-noted findings were photo documented. The hysteroscope removed. The smooth and serrated curettes were then used to curettage the endometrial cavity with production of average tissue. Stone polyp forceps were used to grasp residual tissue left behind. Repeat hysteroscopy was performed and demonstrated excellent sampling. The NovaSure endometrial ablation was then performed in standard fashion. The cervical length was 5 cm; uterus was sounded to 9.5 cm; cavity length was 4.5 cm. The instrument was deployed and the uterine width was noted to be 4.3 cm cavity test was completed. The NovaSure was engaged with successful ablation was performed over 1 minute 43 seconds. Repeat  hysteroscopy demonstrated an excellent char effect within the endometrial cavity.  Procedure was then terminated with all instrumentation being removed from the vagina. The patient was then awakened mobilized and taken to the recovery room in satisfactory condition.  IV fluids: See anesthesia flow sheet mL. Urine output: 125 mL. EBL: Minimal mL.  Instruments, needles, and sponge counts were verified as correct.  Brayton Mars, MD 06/21/2015    Note: This dictation was prepared with Dragon dictation along with smaller phrase technology. Any transcriptional errors that result from this process are unintentional.

## 2015-06-21 NOTE — Anesthesia Procedure Notes (Signed)
Procedure Name: LMA Insertion Date/Time: 06/21/2015 12:11 PM Performed by: Doreen Salvage Pre-anesthesia Checklist: Patient identified, Patient being monitored, Timeout performed, Emergency Drugs available and Suction available Patient Re-evaluated:Patient Re-evaluated prior to inductionOxygen Delivery Method: Circle system utilized Preoxygenation: Pre-oxygenation with 100% oxygen Intubation Type: IV induction Ventilation: Mask ventilation without difficulty LMA: LMA inserted LMA Size: 3.5 Tube type: Oral Number of attempts: 1 Placement Confirmation: positive ETCO2 and breath sounds checked- equal and bilateral Tube secured with: Tape Dental Injury: Teeth and Oropharynx as per pre-operative assessment

## 2015-06-21 NOTE — Transfer of Care (Signed)
Immediate Anesthesia Transfer of Care Note  Patient: Susan Sullivan  Procedure(s) Performed: Procedure(s): DILATATION & CURETTAGE/HYSTEROSCOPY WITH NOVASURE ABLATION (N/A)  Patient Location: PACU  Anesthesia Type:General  Level of Consciousness: awake  Airway & Oxygen Therapy: Patient Spontanous Breathing  Post-op Assessment: Report given to RN  Post vital signs: Reviewed  Last Vitals:  Filed Vitals:   06/21/15 1024  BP: 143/71  Pulse: 99  Temp: 37 C  Resp: 16    Complications: No apparent anesthesia complications

## 2015-06-22 LAB — SURGICAL PATHOLOGY

## 2015-06-22 NOTE — Anesthesia Postprocedure Evaluation (Signed)
Anesthesia Post Note  Patient: Susan Sullivan  Procedure(s) Performed: Procedure(s) (LRB): DILATATION & CURETTAGE/HYSTEROSCOPY WITH NOVASURE ABLATION (N/A)  Patient location during evaluation: PACU Anesthesia Type: General Level of consciousness: awake and alert and oriented Pain management: pain level controlled Vital Signs Assessment: post-procedure vital signs reviewed and stable Respiratory status: spontaneous breathing Cardiovascular status: blood pressure returned to baseline Anesthetic complications: no    Last Vitals:  Filed Vitals:   06/21/15 1358 06/21/15 1428  BP: 113/72 123/70  Pulse: 79 94  Temp: 36.1 C   Resp: 12 12    Last Pain:  Filed Vitals:   06/21/15 1430  PainSc: 6                  Tzivia Oneil

## 2015-06-25 ENCOUNTER — Ambulatory Visit: Payer: BC Managed Care – PPO | Admitting: Oncology

## 2015-06-29 ENCOUNTER — Ambulatory Visit (INDEPENDENT_AMBULATORY_CARE_PROVIDER_SITE_OTHER): Payer: BC Managed Care – PPO | Admitting: Obstetrics and Gynecology

## 2015-06-29 ENCOUNTER — Encounter: Payer: Self-pay | Admitting: Obstetrics and Gynecology

## 2015-06-29 VITALS — BP 123/82 | HR 93 | Ht 67.0 in | Wt 172.3 lb

## 2015-06-29 DIAGNOSIS — Z9889 Other specified postprocedural states: Secondary | ICD-10-CM | POA: Insufficient documentation

## 2015-06-29 DIAGNOSIS — Z09 Encounter for follow-up examination after completed treatment for conditions other than malignant neoplasm: Secondary | ICD-10-CM

## 2015-06-29 NOTE — Progress Notes (Signed)
GYN ENCOUNTER NOTE  Subjective:       Susan Sullivan is a 40 y.o. [phone] female is here for gynecologic evaluation of the following issues:  1. Post-operative visit.     [name] is a 40 yo [phone] female status-post dilation and curretage, hysteroscopy and [place] ablation on 06/21/2015 for menorrhagia. Pathology reveals proliferative endometrium, negative for atypia and malignancy. She recently completed a 7 day course of doxycyline for endometritis. The patient had pain for two days post-op, but she is no longer experiencing pain. She has vaginal discharge consistent with a normal post-operative course. She describes the discharge as thick and yellow, and she uses 4-5 pads per day. The patient denies fever, chills, and sweats.   Gynecologic History Patient's last menstrual period was 06/11/2015. Contraception: NA Last Pap: NA Last mammogram: NA  Obstetric History OB History  Gravida Para Term Preterm AB SAB TAB Ectopic Multiple Living  8 3 2  5 4  1  3     # Outcome Date GA Lbr Len/2nd Weight Sex Delivery Anes PTL Lv  8 SAB 2016          7 Ectopic 2013          6 SAB 2013          5 SAB 2012          4 Para 2010   9 lb (4.082 kg) M CS-LTranv   Y  3 Term 2001   8 lb 9.6 oz (3.901 kg) F CS-LTranv   Y     Complications: GDM (gestational diabetes mellitus)  2 Term 1999   9 lb (4.082 kg) M Vag-Spont   Y  1 SAB 1999              Past Medical History  Diagnosis Date  . Ectopic pregnancy, tubal Sept 2012    left,  with rupture ,  and concurrent right ovarian cyst rupture  . 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 Dysrhythmia     tachycardia - sees dr. Fletcher Anon  . Chronic lower back pain   . Complication of anesthesia     ketamin - difficulty waking up  . Complication of anesthesia     doesn't metabolize gases well, stays in systems for days  . Gestational diabetes     "only  when I'm pregnant" (11/13/2012)  . Degenerative disk disease     not on medication due to attempts to get pregant    Past Surgical History  Procedure Laterality Date  . Cesarean section  2002; 2010  . Cholecystectomy  Dec 2012    Jason Dew, [facility]  . Posterior lumbar fusion  11/13/2012  . Augmentation mammaplasty Bilateral 2007  . Laparoscopy for ectopic pregnancy  2012  . Spine surgery  2007    SI joint fusion [employer]  . Spine surgery  June 2014    Earle Gell  L4-L5  . Dilation and curettage of uterus  2012; 2013    x 4  . Dilitation & currettage/hystroscopy with novasure ablation N/A 06/21/2015    Procedure: DILATATION & CURETTAGE/HYSTEROSCOPY WITH NOVASURE ABLATION;  Surgeon: Brayton Mars, MD;  Location: [facility] ORS;  Service: Gynecology;  Laterality: N/A;    Current Outpatient Prescriptions on File Prior to Visit  Medication Sig Dispense Refill  . albuterol (PROAIR HFA) 108 (90 BASE) MCG/ACT inhaler Inhale 2 puffs  into the lungs every 6 (six) hours as needed for wheezing. 6.7 g 11  . aspirin 81 MG tablet Take 81 mg by mouth daily.    [name] b complex vitamins capsule Take by mouth.    . cetirizine (ZYRTEC) 10 MG tablet Take 10 mg by mouth daily as needed for allergies.     . Cholecalciferol (VITAMIN D3) 10000 units capsule Take 10,000 Units by mouth every morning.    . cyclobenzaprine (FLEXERIL) 10 MG tablet Take 1 tablet (10 mg total) by mouth 3 (three) times daily as needed for muscle spasms. 60 tablet 2  . diphenhydrAMINE (BENADRYL) 25 MG tablet Take 25 mg by mouth every 6 (six) hours as needed for itching or allergies.    [name] doxycycline (VIBRAMYCIN) 100 MG capsule Take 1 capsule (100 mg total) by mouth 2 (two) times daily. 14 capsule 0  . EPINEPHrine (EPI-PEN) 0.3 mg/0.3 mL SOAJ injection Inject 0.3 mLs (0.3 mg total) into the muscle once. 2 Device 0  . fluconazole (DIFLUCAN) 150 MG tablet Take 1 tablet (150 mg total) by mouth daily. 1 tablet 1  . montelukast  (SINGULAIR) 10 MG tablet Take 10 mg by mouth daily as needed.    . mupirocin ointment (BACTROBAN) 2 % Place 1 application into the nose 2 (two) times daily. Apply to each nostril twice a day for 5 days 22 g 0  . traMADol (ULTRAM) 50 MG tablet Take 1 tablet (50 mg total) by mouth every 6 (six) hours as needed. 60 tablet 4  . Vitamin D, Ergocalciferol, (DRISDOL) 50000 units CAPS capsule Take 50,000 Units by mouth every 7 (seven) days.    . phentermine 37.5 MG capsule Take 37.5 mg by mouth every morning.    . ranitidine (ZANTAC) 300 MG tablet Take 1 tablet (300 mg total) by mouth at bedtime. (Patient taking differently: Take 300 mg by mouth at bedtime. As needed) 90 tablet 3   No current facility-administered medications on file prior to visit.    Allergies  Allergen Reactions  . Clams [Shellfish Allergy] Nausea And Vomiting  . Clindamycin/Lincomycin Anaphylaxis  . Dilaudid [Hydromorphone] Anaphylaxis    Patient worried it caused anaphylaxis  . Gabapentin Other (See Comments)    agitation Hyperacitivity.   . Ketamine Other (See Comments)    Doesn't wake up  . Latex Anaphylaxis, Itching and Rash  . Nabumetone     Hallucinations, felt like she could kill herself  . Other Anaphylaxis    Peanut allergy, silk tape Narcotics-rash, itching; all nuts; peas; lemons (anaphylaxis); melon anesthesia gases  . Peanut-Containing Drug Products Anaphylaxis  . Peanuts [Peanut Oil] Anaphylaxis  . Spinach Nausea And Vomiting  . Celebrex [Celecoxib] Other (See Comments)    agitation Insomnia   . Adderall [Amphetamine-Dextroamphetamine] Other (See Comments)    violent  . Besivance [Besifloxacin Hcl]   . Betadine [Povidone Iodine]   . Cymbalta [Duloxetine Hcl] Other (See Comments)    Hyperactivity.   . Eggs Or Egg-Derived Products     Also raw fruits/veggies  . Meat Extract Nausea And Vomiting    Red meat and pork.   . Nubain [Nalbuphine Hcl] Itching  . Propofol Diarrhea and Other (See Comments)     Allergic to Soy Never had issue with prior surgeries receiving this  . Shellfish-Derived Products Other (See Comments)    Positive on RAZ testing  . Soy Allergy Other (See Comments)    GI upset; avoids propofal  . Sulfa Antibiotics   . Hydrocodone Rash  .  Sudafed [Pseudoephedrine Hcl] Other (See Comments) and Palpitations    palpatations Tachycardia   . Tape Rash    OK to use paper tape for short periods of time.    Social History   Social History  . Marital Status: Married    Spouse Name: N/A  . Number of Children: N/A  . Years of Education: N/A   Occupational History  . Not on file.   Social History Main Topics  . Smoking status: Never Smoker   . Smokeless tobacco: Never Used  . Alcohol Use: No  . Drug Use: No  . Sexual Activity: Yes    Birth Control/ Protection: Other-see comments     Comment: vasectomy   Other Topics Concern  . Not on file   Social History Narrative    Family History  Problem Relation Age of Onset  . Arthritis Mother   . Hyperlipidemia Mother   . Heart disease Mother   . Diabetes Mother   . Arthritis Father   . Hyperlipidemia Father   . Heart disease Father 41    CABG   . Diabetes Father   . Stroke Paternal Grandmother   . Heart disease Paternal Grandmother   . Heart disease Paternal Grandfather   . Cancer Neg Hx     The following portions of the patient's history were reviewed and updated as appropriate: allergies, current medications, past family history, past medical history, past social history, past surgical history and problem list.  Review of Systems Review of Systems - General ROS: negative for - chills, fatigue, fever, night sweats Gastrointestinal ROS: negative for - abdominal pain Genito-Urinary ROS: positive for vaginal discharge  Objective:   BP 123/82 mmHg  Pulse 93  Ht 5\' 7"  (1.702 m)  Wt 172 lb 4.8 oz (78.155 kg)  BMI 26.98 kg/m2  LMP 06/11/2015 CONSTITUTIONAL: Well-developed, well-nourished female in  no acute distress.  HENT: Normocephalic, atraumatic.  [place]: Alert and oriented to person, place, and time.  PSYCHIATRIC: Normal mood and affect. Normal behavior. Normal judgment and thought content. CARDIOVASCULAR:Normal s1/s2 no m/r/g RESPIRATORY: CTAB BREASTS: Not Examined ABDOMEN: Soft, non distended; Non tender. No Organomegaly. PELVIC: Yellow discharge, slight cervical motion tenderness and slight uterine tenderness consistent with post-operative changes. (No evidence of acute infection).     Assessment:   1. Postop check-One-week   2. Status post endometrial ablation, Hysteroscopy/D&C    Plan:   1. Expect vaginal discharge for 4-6 weeks.  2. No intercourse for 4 weeks post-op 3.  Follow-up if fevers, chills, Sweats, or pelvic pain develop 4.  Return in 6 months for follow-up on abnormal uterine bleeding   Loni Muse PA-S Brayton Mars, MD    I have seen, interviewed, and examined the patient in conjunction with the [facility].A. student and affirm the diagnosis and management plan. Chey Cho A. Maysun Meditz, MD, FACOG    Note: This dictation was prepared with Dragon dictation along with smaller phrase technology. Any transcriptional errors that result from this process are unintentional.

## 2015-06-29 NOTE — Patient Instructions (Signed)
1.  No sex for a total of 4 weeks postop. 2.  Expect vaginal discharge to persist for 4 weeks postop. 3.  Return in 6 months for follow-up on abnormal uterine bleeding

## 2015-07-02 ENCOUNTER — Inpatient Hospital Stay: Payer: BC Managed Care – PPO | Attending: Oncology | Admitting: Oncology

## 2015-07-02 ENCOUNTER — Inpatient Hospital Stay: Payer: BC Managed Care – PPO

## 2015-07-02 VITALS — BP 154/81 | HR 121 | Temp 97.2°F | Resp 18 | Wt 172.4 lb

## 2015-07-02 DIAGNOSIS — G8929 Other chronic pain: Secondary | ICD-10-CM | POA: Diagnosis not present

## 2015-07-02 DIAGNOSIS — E538 Deficiency of other specified B group vitamins: Secondary | ICD-10-CM | POA: Diagnosis not present

## 2015-07-02 DIAGNOSIS — Z7982 Long term (current) use of aspirin: Secondary | ICD-10-CM

## 2015-07-02 DIAGNOSIS — Z1589 Genetic susceptibility to other disease: Secondary | ICD-10-CM

## 2015-07-02 DIAGNOSIS — R000 Tachycardia, unspecified: Secondary | ICD-10-CM | POA: Diagnosis not present

## 2015-07-02 DIAGNOSIS — Z91018 Allergy to other foods: Secondary | ICD-10-CM | POA: Insufficient documentation

## 2015-07-02 DIAGNOSIS — E7211 Homocystinuria: Secondary | ICD-10-CM | POA: Insufficient documentation

## 2015-07-02 DIAGNOSIS — Z91010 Allergy to peanuts: Secondary | ICD-10-CM | POA: Diagnosis not present

## 2015-07-02 DIAGNOSIS — Z91013 Allergy to seafood: Secondary | ICD-10-CM | POA: Diagnosis not present

## 2015-07-02 DIAGNOSIS — Z79899 Other long term (current) drug therapy: Secondary | ICD-10-CM | POA: Insufficient documentation

## 2015-07-02 DIAGNOSIS — E7212 Methylenetetrahydrofolate reductase deficiency: Secondary | ICD-10-CM | POA: Insufficient documentation

## 2015-07-02 DIAGNOSIS — M549 Dorsalgia, unspecified: Secondary | ICD-10-CM | POA: Insufficient documentation

## 2015-07-02 LAB — FOLATE: Folate: 10.9 ng/mL (ref >5.9)

## 2015-07-02 LAB — TSH: TSH: 1.427 u[IU]/mL (ref 0.350–4.500)

## 2015-07-02 NOTE — Progress Notes (Signed)
Patient here today as new evaluation regarding MTHFR mutation.  Referred by Dr Georga Bora.

## 2015-07-03 LAB — VITAMIN B12: Vitamin B-12: 163 pg/mL — ABNORMAL LOW (ref 180–914)

## 2015-07-05 LAB — BETA-2-GLYCOPROTEIN I ABS, IGG/M/A
Beta-2 Glyco I IgG: <9 GPI IgG units (ref 0–20)
Beta-2-Glycoprotein I IgA: <9 GPI IgA units (ref 0–25)
Beta-2-Glycoprotein I IgM: 9 GPI IgM units (ref 0–32)

## 2015-07-05 LAB — CARDIOLIPIN ANTIBODIES, IGG, IGM, IGA: Anticardiolipin IgA: <9 APL U/mL (ref 0–11)

## 2015-07-05 LAB — HOMOCYSTEINE: Homocysteine: 11.3 umol/L (ref 0.0–15.0)

## 2015-07-05 LAB — LUPUS ANTICOAGULANT PANEL
DRVVT: 44 s (ref 0.0–44.0)
PTT LA: 40.5 s (ref 0.0–40.6)

## 2015-07-08 ENCOUNTER — Encounter: Payer: Self-pay | Admitting: Oncology

## 2015-07-08 NOTE — Progress Notes (Signed)
[facility]  [phone] [phone]  ID: Susan Sullivan OB: November 29, 1975  MR#: [id number]  [id number]  Patient Care Team: Pcp Not In System as PCP - General  CHIEF COMPLAINT:  Chief Complaint  Patient presents with  . 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  She has no other history of clotting. She denies any family history of clotting. She currently feels well and is asymptomatic. She has no neurologic complaints. She denies any recent fevers or illnesses. She has a good appetite and denies weight loss. She denies any pain. She denies any chest pain or shortness of breath. She denies any nausea, vomiting, constipation, or diarrhea. She has no melena or hematochezia. She has no urinary complaints. Patient feels at her baseline and offers no specific complaints today.  REVIEW OF SYSTEMS:   Review of Systems  Constitutional: Negative for fever, weight loss and malaise/fatigue.  Respiratory: Negative.  Negative for cough and shortness of breath.   Cardiovascular: Negative.  Negative for chest pain.  Gastrointestinal: Negative.  Negative for abdominal pain, blood in stool and melena.  Musculoskeletal: Negative.   Neurological: Negative.  Negative for weakness.    As per HPI. Otherwise, a complete review of systems is negatve.  PAST MEDICAL HISTORY: Past Medical History  Diagnosis Date  . Ectopic pregnancy, tubal Sept 2012    left,  with rupture ,  and concurrent right ovarian cyst rupture  . 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 Dysrhythmia     tachycardia - sees dr. Fletcher Anon  . Chronic lower back pain   . Complication of anesthesia     ketamin - difficulty waking up  . Complication  of anesthesia     doesn't metabolize gases well, stays in systems for days  . Gestational diabetes     "only when I'm pregnant" (11/13/2012)  . Degenerative disk disease     not on medication due to attempts to get pregant    PAST SURGICAL HISTORY: Past Surgical History  Procedure Laterality Date  . Cesarean section  2002; 2010  . Cholecystectomy  Dec 2012    Jason Dew, [facility]  . Posterior lumbar fusion  11/13/2012  . Augmentation mammaplasty Bilateral 2007  . Laparoscopy for ectopic pregnancy  2012  . Spine surgery  2007    SI joint fusion [employer]  . Spine surgery  June 2014    Earle Gell  L4-L5  . Dilation and curettage of uterus  2012; 2013    x 4  . Dilitation & currettage/hystroscopy with novasure ablation N/A 06/21/2015    Procedure: DILATATION & CURETTAGE/HYSTEROSCOPY WITH NOVASURE ABLATION;  Surgeon: Brayton Mars, MD;  Location: [facility] ORS;  Service: Gynecology;  Laterality: N/A;    FAMILY HISTORY Family History  Problem Relation Age of Onset  . Arthritis Mother   . Hyperlipidemia Mother   . Heart disease Mother   . Diabetes Mother   . Arthritis Father   . Hyperlipidemia Father   . Heart disease Father 36    CABG   . Diabetes Father   . Stroke Paternal Grandmother   . Heart disease Paternal Grandmother   . Heart disease Paternal Grandfather   . Cancer Neg Hx  ADVANCED DIRECTIVES:    HEALTH MAINTENANCE: Social History  Substance Use Topics  . Smoking status: Never Smoker   . Smokeless tobacco: Never Used  . Alcohol Use: No     Colonoscopy:  PAP:  Bone density:  Lipid panel:  Allergies  Allergen Reactions  . Clams [Shellfish Allergy] Nausea And Vomiting  . Clindamycin/Lincomycin Anaphylaxis  . Dilaudid [Hydromorphone] Anaphylaxis    Patient worried it caused anaphylaxis  . Gabapentin Other (See Comments)    agitation Hyperacitivity.   . Ketamine Other (See Comments)    Doesn't wake up  . Latex Anaphylaxis, Itching  and Rash  . Nabumetone     Hallucinations, felt like she could kill herself  . Other Anaphylaxis    Peanut allergy, silk tape Narcotics-rash, itching; all nuts; peas; lemons (anaphylaxis); melon anesthesia gases  . Peanut-Containing Drug Products Anaphylaxis  . Peanuts [Peanut Oil] Anaphylaxis  . Spinach Nausea And Vomiting  . Celebrex [Celecoxib] Other (See Comments)    agitation Insomnia   . Adderall [Amphetamine-Dextroamphetamine] Other (See Comments)    violent  . Besivance [Besifloxacin Hcl]   . Betadine [Povidone Iodine]   . Cymbalta [Duloxetine Hcl] Other (See Comments)    Hyperactivity.   . Eggs Or Egg-Derived Products     Also raw fruits/veggies  . Meat Extract Nausea And Vomiting    Red meat and pork.   . Nubain [Nalbuphine Hcl] Itching  . Propofol Diarrhea and Other (See Comments)    Allergic to Soy Never had issue with prior surgeries receiving this  . Shellfish-Derived Products Other (See Comments)    Positive on RAZ testing  . Soy Allergy Other (See Comments)    GI upset; avoids propofal  . Sulfa Antibiotics   . Hydrocodone Rash  . Sudafed [Pseudoephedrine Hcl] Other (See Comments) and Palpitations    palpatations Tachycardia   . Tape Rash    OK to use paper tape for short periods of time.    Current Outpatient Prescriptions  Medication Sig Dispense Refill  . albuterol (PROAIR HFA) 108 (90 BASE) MCG/ACT inhaler Inhale 2 puffs into the lungs every 6 (six) hours as needed for wheezing. 6.7 g 11  . aspirin 81 MG tablet Take 81 mg by mouth daily.    . Biotin 5 MG CAPS Take by mouth.    . cetirizine (ZYRTEC) 10 MG tablet Take 10 mg by mouth daily as needed for allergies.     . Cholecalciferol (VITAMIN D3) 10000 units capsule Take 10,000 Units by mouth every morning.    . cyclobenzaprine (FLEXERIL) 10 MG tablet Take 1 tablet (10 mg total) by mouth 3 (three) times daily as needed for muscle spasms. 60 tablet 2  . diphenhydrAMINE (BENADRYL) 25 MG tablet Take 25  mg by mouth every 6 (six) hours as needed for itching or allergies.    [name] EPINEPHrine (EPI-PEN) 0.3 mg/0.3 mL SOAJ injection Inject 0.3 mLs (0.3 mg total) into the muscle once. 2 Device 0  . ibuprofen (ADVIL,MOTRIN) 800 MG tablet Take 800 mg by mouth every 8 (eight) hours as needed.    . montelukast (SINGULAIR) 10 MG tablet Take 10 mg by mouth daily as needed.    . phentermine 37.5 MG capsule Take 37.5 mg by mouth every morning.    . ranitidine (ZANTAC) 300 MG tablet Take 300 mg by mouth at bedtime.    . traMADol (ULTRAM) 50 MG tablet Take 1 tablet (50 mg total) by mouth every 6 (six) hours as needed. 60 tablet 4  .  Vitamin D, Ergocalciferol, (DRISDOL) 50000 units CAPS capsule Take 50,000 Units by mouth every 7 (seven) days.    . ranitidine (ZANTAC) 300 MG tablet Take 1 tablet (300 mg total) by mouth at bedtime. (Patient taking differently: Take 300 mg by mouth at bedtime. As needed) 90 tablet 3   No current facility-administered medications for this visit.    OBJECTIVE: Filed Vitals:   07/02/15 0853  BP: 154/81  Pulse: 121  Temp: 97.2 F (36.2 C)  Resp: 18     Body mass index is 27 kg/(m^2).    ECOG FS:0 - Asymptomatic  General: Well-developed, well-nourished, no acute distress. Eyes: Pink conjunctiva, anicteric sclera. HEENT: Normocephalic, moist mucous membranes, clear oropharnyx. Lungs: Clear to auscultation bilaterally. Heart: Regular rate and rhythm. No rubs, murmurs, or gallops. Abdomen: Soft, nontender, nondistended. No organomegaly noted, normoactive bowel sounds. Musculoskeletal: No edema, cyanosis, or clubbing. Neuro: Alert, answering all questions appropriately. Cranial nerves grossly intact. Skin: No rashes or petechiae noted. Psych: Normal affect. Lymphatics: No cervical, calvicular, axillary or inguinal LAD.   LAB RESULTS:  Lab Results  Component Value Date   NA 143 05/19/2013   K 3.8 05/19/2013   CL 105 05/19/2013   CO2 29 05/19/2013   GLUCOSE 129*  05/19/2013   BUN 8 05/19/2013   CREATININE 0.7 05/19/2013   CALCIUM 9.3 05/19/2013   PROT 7.5 05/19/2013   ALBUMIN 4.2 05/19/2013   AST 22 05/19/2013   ALT 18 05/19/2013   ALKPHOS 66 05/19/2013   BILITOT 0.4 05/19/2013   GFRNONAA >90 11/23/2012   GFRAA >90 11/23/2012    Lab Results  Component Value Date   WBC 6.6 06/16/2015   NEUTROABS 4.2 06/16/2015   HGB 12.4 06/16/2015   HCT 38.5 06/16/2015   MCV 87.9 06/16/2015   PLT 245 06/16/2015     STUDIES: No results found.  ASSESSMENT: MTHFR mutation with multiple miscarriages.  PLAN:    1. MTHFR mutation:  Typically, this is clinically insignificant unless there is evidence of hyperhomocystinemia. Plus. hyperhomocystinemia is typically related with MIs or strokes. Patient has a significant family history, does not have any personal history of cardiac or neurologic problems. Patient's homocystine levels are within normal limits at 11.3. A full hypercoagulable workup was initiated previously and was negative. The remainder of her workup was done today and it is also negative. TSH is within normal limits.  She has no evidence of a thrombophilic disorder. Unclear etiology of her multiple miscarriages. No further intervention is needed. Patient does not require additional follow-up in the [place]. 2. Decreased B-12 levels: Patient has a mildly decreased serum B-12 without anemia. Recommend patient take oral B-12 supplementation.  Approximate 45 minutes was spent in discussion of which greater than 50% was consultation.  Patient expressed understanding and was in agreement with this plan. She also understands that She can call clinic at any time with any questions, concerns, or complaints.    Lloyd Huger, MD   07/08/2015 4:46 PM

## 2015-07-09 ENCOUNTER — Encounter: Payer: Self-pay | Admitting: Oncology

## 2015-08-02 ENCOUNTER — Other Ambulatory Visit: Payer: Self-pay | Admitting: Internal Medicine

## 2015-08-02 DIAGNOSIS — G9349 Other encephalopathy: Secondary | ICD-10-CM

## 2015-08-20 ENCOUNTER — Ambulatory Visit
Admission: RE | Admit: 2015-08-20 | Discharge: 2015-08-20 | Disposition: A | Discharge status: Home / Self Care | Payer: BC Managed Care – PPO | Source: Ambulatory Visit | Attending: Internal Medicine | Admitting: Internal Medicine

## 2015-08-20 ENCOUNTER — Other Ambulatory Visit: Payer: Self-pay | Admitting: Internal Medicine

## 2015-08-20 DIAGNOSIS — G9349 Other encephalopathy: Secondary | ICD-10-CM | POA: Insufficient documentation

## 2015-11-09 ENCOUNTER — Ambulatory Visit: Payer: BC Managed Care – PPO | Attending: Neurology

## 2015-11-09 DIAGNOSIS — G4733 Obstructive sleep apnea (adult) (pediatric): Secondary | ICD-10-CM | POA: Insufficient documentation

## 2015-11-09 DIAGNOSIS — R0683 Snoring: Secondary | ICD-10-CM | POA: Diagnosis present

## 2015-11-09 DIAGNOSIS — G4710 Hypersomnia, unspecified: Secondary | ICD-10-CM | POA: Diagnosis present

## 2016-01-18 ENCOUNTER — Ambulatory Visit: Payer: BC Managed Care – PPO | Admitting: Obstetrics and Gynecology

## 2016-04-25 ENCOUNTER — Other Ambulatory Visit (INDEPENDENT_AMBULATORY_CARE_PROVIDER_SITE_OTHER): Payer: BC Managed Care – PPO

## 2016-04-25 ENCOUNTER — Ambulatory Visit (INDEPENDENT_AMBULATORY_CARE_PROVIDER_SITE_OTHER): Payer: BC Managed Care – PPO | Admitting: Internal Medicine

## 2016-04-25 ENCOUNTER — Encounter: Payer: Self-pay | Admitting: Internal Medicine

## 2016-04-25 VITALS — BP 118/80 | HR 88 | Ht 67.0 in | Wt 197.5 lb

## 2016-04-25 DIAGNOSIS — R1084 Generalized abdominal pain: Secondary | ICD-10-CM

## 2016-04-25 DIAGNOSIS — E538 Deficiency of other specified B group vitamins: Secondary | ICD-10-CM | POA: Diagnosis not present

## 2016-04-25 DIAGNOSIS — R1110 Vomiting, unspecified: Secondary | ICD-10-CM

## 2016-04-25 DIAGNOSIS — R195 Other fecal abnormalities: Secondary | ICD-10-CM

## 2016-04-25 LAB — COMPREHENSIVE METABOLIC PANEL
ALBUMIN: 4.5 g/dL (ref 3.5–5.2)
ALK PHOS: 75 U/L (ref 39–117)
ALT: 13 U/L (ref 0–35)
AST: 15 U/L (ref 0–37)
BUN: 14 mg/dL (ref 6–23)
CHLORIDE: 102 meq/L (ref 96–112)
CO2: 28 mEq/L (ref 19–32)
Calcium: 9.7 mg/dL (ref 8.4–10.5)
Creatinine, Ser: 0.71 mg/dL (ref 0.40–1.20)
GFR: 96.79 mL/min (ref >60.00)
Glucose, Bld: 95 mg/dL (ref 70–99)
POTASSIUM: 3.5 meq/L (ref 3.5–5.1)
SODIUM: 139 meq/L (ref 135–145)
TOTAL PROTEIN: 7.9 g/dL (ref 6.0–8.3)
Total Bilirubin: 0.3 mg/dL (ref 0.2–1.2)

## 2016-04-25 LAB — CBC WITH DIFFERENTIAL/PLATELET
BASOS PCT: 0.6 % (ref 0.0–3.0)
Basophils Absolute: 0.1 10*3/uL (ref 0.0–0.1)
EOS PCT: 1.4 % (ref 0.0–5.0)
Eosinophils Absolute: 0.1 10*3/uL (ref 0.0–0.7)
HCT: 39.4 % (ref 36.0–46.0)
HEMOGLOBIN: 13.4 g/dL (ref 12.0–15.0)
LYMPHS ABS: 2.1 10*3/uL (ref 0.7–4.0)
Lymphocytes Relative: 23.9 % (ref 12.0–46.0)
MCHC: 34 g/dL (ref 30.0–36.0)
MCV: 89.9 fl (ref 78.0–100.0)
MONO ABS: 0.6 10*3/uL (ref 0.1–1.0)
MONOS PCT: 6.4 % (ref 3.0–12.0)
Neutro Abs: 5.8 10*3/uL (ref 1.4–7.7)
Neutrophils Relative %: 67.7 % (ref 43.0–77.0)
Platelets: 254 10*3/uL (ref 150.0–400.0)
RBC: 4.38 Mil/uL (ref 3.87–5.11)
RDW: 13.7 % (ref 11.5–15.5)
WBC: 8.6 10*3/uL (ref 4.0–10.5)

## 2016-04-25 LAB — FOLATE: Folate: 18.6 ng/mL (ref >5.9)

## 2016-04-25 LAB — VITAMIN B12: Vitamin B-12: >1500 pg/mL — ABNORMAL HIGH (ref 211–911)

## 2016-04-25 MED ORDER — CYANOCOBALAMIN 1000 MCG/ML IJ SOLN: INTRAMUSCULAR | 1 refills | Status: AC

## 2016-04-25 MED ORDER — ONDANSETRON HCL 4 MG PO TABS: 4.0000 mg | ORAL_TABLET | Freq: Three times a day (TID) | ORAL | 0 refills | Status: DC | PRN

## 2016-04-25 NOTE — Patient Instructions (Addendum)
Your physician has requested that you go to the basement for the following lab work before leaving today: B12, folate, cbc, cmp, intrinsic factor antibody  Your physician has requested that you have specialized "[facility]" lab work completed. Please take the order form given to you at your visit today to [facility] lab as well as the blood kit sent to your home adress. We will contact you once results have been made available to [place]. If you have not heard from our office within 2 weeks after having lab work completed, please contact us at [phone].  We have sent the following medications to your pharmacy for you to pick up at your convenience: zofran 4 mg every 8 hours as needed B12 injections-1 ml IM every week x 4 weeks, then 1 ml IM every 2 weeks x 2 months, then 1 ml IM once monthly thereafter.  Please follow up with Dr Hilarie Fredrickson in 3 months.  If you are age 40 or older, your body mass index should be between 23-30. Your Body mass index is 30.93 kg/m. If this is out of the aforementioned range listed, please consider follow up with your Primary Care Provider.  If you are age 65 or younger, your body mass index should be between 19-25. Your Body mass index is 30.93 kg/m. If this is out of the aformentioned range listed, please consider follow up with your Primary Care Provider.

## 2016-04-25 NOTE — Progress Notes (Signed)
Subjective:    Patient ID: Susan Sullivan, female    DOB: 10-Sep-1975, 40 y.o.   MRN: [id number]  HPI [name] is a 40 year old female with a past medical history of chronic diarrhea, B12 deficiency, reported pernicious anemia, MTHFR gene mutation with normal homocystine level, vitamin D deficiency, possible chronic fatigue who is seen to evaluate vomiting, abdominal pain and continued chronic diarrhea. She is here alone today. I saw her initially in December 2013, to evaluate nearly the same type symptoms. After this visit she had an upper endoscopy and colonoscopy performed in December 2013. Her upper endoscopy was normal. Her colonoscopy to the terminal ileum was normal with biopsies also normal. External hemorrhoids were noted.  She reports recently she's been struggling with a myriad of symptoms. She's having vomiting without much nausea. This is much worse when she eats out. 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 If she doesn't vomit when she feels the need to, she will develop severe generalized abdominal pain. She's having urgent loose stools after any meal. This is worsened since 2012 when she had cholecystectomy. She has loose stools regardless of her upper GI symptoms, specifically vomiting. Again she is not having much nausea but is aware by hypersalivation when she needs to vomit. Her appetite is good but she avoids eating due to her loose stools. She's had issues with her B12 levels. They were low and then she was put on IM B12 levels improved and she felt much better but was switched back to oral sometime about 6 months ago. Symptoms have declined. She has arm pain and tingling, stuttering, forgetfulness and issues with memory. She's been seen by neurology. An MRI of the brain was normal. She is working as a Marine scientist at [employer], in the ER mainly  She denies heartburn, dysphagia and odynophagia.  Weight overall is stable. She denies blood in her stool or melena. She was taking phentermine to try to help loose weight but this has been stopped.  She feels desperate to have her symptoms improve    Review of Systems As per HPI, otherwise negative  Current Medications, Allergies, Past Medical History, Past Surgical History, Family History and Social History were reviewed in [facility] record.     Objective:   Physical Exam BP 118/80   Pulse 88   Ht 5' 7"  (1.702 m)   Wt 197 lb 8 oz (89.6 kg)   BMI 30.93 kg/m  Constitutional: Well-developed and well-nourished. No distress. HEENT: Normocephalic and atraumatic. Oropharynx is clear and moist. No oropharyngeal exudate. Conjunctivae are normal.  No scleral icterus. Neck: Neck supple. Trachea midline. Cardiovascular: Normal rate, regular rhythm and intact distal pulses. No M/R/G Pulmonary/chest: Effort normal and breath sounds normal. No wheezing, rales or rhonchi. Abdominal: Soft, nontender, nondistended. Bowel sounds active throughout. There are no masses palpable. No hepatosplenomegaly. Extremities: no clubbing, cyanosis, or edema Lymphadenopathy: No cervical adenopathy noted. Neurological: Alert and oriented to person place and time. Skin: Skin is warm and dry. No rashes noted. Tanned Psychiatric: Normal mood and affect. Behavior is normal.  Lab Results  Component Value Date   VITAMINB12 163 (L) 07/02/2015   CBC    Component Value Date/Time   WBC 6.6 06/16/2015 1123   RBC 4.38 06/16/2015 1123   HGB 12.4 06/16/2015 1123   HGB 10.3 (L) 11/23/2012 0432   HCT 38.5 06/16/2015 1123   HCT 30.4 (L) 11/23/2012  0432   PLT 245 06/16/2015 1123   PLT 335 11/23/2012 0432   MCV 87.9 06/16/2015 1123   MCV 90 11/23/2012 0432   MCH 28.4 06/16/2015 1123   MCHC 32.4 06/16/2015 1123   RDW 14.4 06/16/2015 1123   RDW 13.4 11/23/2012 0432   LYMPHSABS 2.0 06/16/2015 1123   LYMPHSABS 2.4 11/23/2012 0432   MONOABS  0.3 06/16/2015 1123   MONOABS 0.4 11/23/2012 0432   EOSABS 0.1 06/16/2015 1123   EOSABS 0.3 11/23/2012 0432   BASOSABS 0.1 06/16/2015 1123   BASOSABS 0.0 11/23/2012 0432       Assessment & Plan:  40 year old female with a past medical history of chronic diarrhea, B12 deficiency, reported pernicious anemia, MTHFR gene mutation with normal homocystine level, vitamin D deficiency, possible chronic fatigue who is seen to evaluate vomiting, abdominal pain and continued chronic diarrhea.  1. B12 def/Likely pernicious anemia -- if she does have intrinsic factor antibody she will not be able to absorb B12 no matter what her oral doses. I have recommended that she resume IM B12 indefinitely. I will have her do 1000 g intramuscular injection weekly 1 month, every other week 2 month then monthly. Check B12 today. Check intrinsic factor antibody.  2. Urgent loose stools -- prior colonoscopy negative for microscopic colitis and evidence of IBD. This could be bile salt diarrhea as symptoms worsened after cholecystectomy. I'm ordering the IBcause test which will evaluate for bile salt diarrhea but also rule out celiac disease, check fecal calprotectin, check fecal elastase, and rule out infection etc.  Will await the findings and then decide how best to treat. She may need cholestyramine or colestipol. --Repeat CBC and CMP  3. Vomiting without nausea -- may eventually need gastric emptying study. She feels that her vomiting improved but her B12 was more normal on the IM injection. We are resuming IM B12 as discussed in #1. Zofran 4 mg every 6 hours as needed for vomiting. Prior EGD was normal. We'll work this up further if necessary after above-mentioned testing and treatment.  Follow-up in 2-3 months, sooner necessary 45 minutes spent with the patient today. Greater than 50% was spent in counseling and coordination of care with the patient

## 2016-04-26 LAB — INTRINSIC FACTOR ANTIBODIES: Intrinsic Factor: POSITIVE — AB

## 2016-04-27 ENCOUNTER — Encounter: Payer: Self-pay | Admitting: Internal Medicine

## 2016-05-09 ENCOUNTER — Other Ambulatory Visit: Payer: Self-pay | Admitting: Internal Medicine

## 2016-05-09 DIAGNOSIS — M5010 Cervical disc disorder with radiculopathy, unspecified cervical region: Secondary | ICD-10-CM

## 2016-05-19 ENCOUNTER — Encounter: Payer: Self-pay | Admitting: Internal Medicine

## 2016-05-19 ENCOUNTER — Ambulatory Visit
Admission: RE | Admit: 2016-05-19 | Discharge: 2016-05-19 | Disposition: A | Discharge status: Home / Self Care | Payer: BC Managed Care – PPO | Source: Ambulatory Visit | Attending: Internal Medicine | Admitting: Internal Medicine

## 2016-05-19 DIAGNOSIS — M50122 Cervical disc disorder at C5-C6 level with radiculopathy: Secondary | ICD-10-CM | POA: Insufficient documentation

## 2016-05-19 DIAGNOSIS — M5010 Cervical disc disorder with radiculopathy, unspecified cervical region: Secondary | ICD-10-CM | POA: Diagnosis present

## 2016-05-25 ENCOUNTER — Telehealth: Payer: Self-pay | Admitting: *Deleted

## 2016-05-25 MED ORDER — COLESTIPOL HCL 1 G PO TABS: 2.0000 g | ORAL_TABLET | ORAL | 2 refills | Status: DC

## 2016-05-25 NOTE — Telephone Encounter (Signed)
Dr Hilarie Fredrickson has received results of IBCause test (done by [facility]). He notes: "results reviewed. Test shows bile acid diarrhea as cause of chronic loose stools. Inflammatory eval negative. Infectious eval negative. Begin colestipol 2 grams every morning to start. Dose may need titration. Call if ineffective. Office follow up recommended."  I have spoken to patient to advise her of these results. I have advised that she will need to take colestipol 2 g every morning to help bind stool. She verbalizes understanding and script has been sent to pharmacy. Patient is also scheduled for office follow up on 07/14/16 @ 8:45 am which she also verbalizes understanding of.

## 2016-05-26 ENCOUNTER — Telehealth: Payer: Self-pay | Admitting: Internal Medicine

## 2016-05-26 NOTE — Telephone Encounter (Signed)
Pharmacist at [facility] says that they do have the prescription for colestipol but to not have the medication. They had to order the prescription and it should arrive on Monday. Patient advised.

## 2016-07-14 ENCOUNTER — Ambulatory Visit (INDEPENDENT_AMBULATORY_CARE_PROVIDER_SITE_OTHER): Payer: Managed Care, Other (non HMO) | Admitting: Internal Medicine

## 2016-07-14 ENCOUNTER — Encounter: Payer: Self-pay | Admitting: Internal Medicine

## 2016-07-14 VITALS — BP 118/72 | HR 86 | Ht 67.0 in | Wt 201.0 lb

## 2016-07-14 DIAGNOSIS — D510 Vitamin B12 deficiency anemia due to intrinsic factor deficiency: Secondary | ICD-10-CM | POA: Diagnosis not present

## 2016-07-14 DIAGNOSIS — Z6831 Body mass index (BMI) 31.0-31.9, adult: Secondary | ICD-10-CM | POA: Diagnosis not present

## 2016-07-14 DIAGNOSIS — E538 Deficiency of other specified B group vitamins: Secondary | ICD-10-CM | POA: Diagnosis not present

## 2016-07-14 DIAGNOSIS — R110 Nausea: Secondary | ICD-10-CM

## 2016-07-14 DIAGNOSIS — K9089 Other intestinal malabsorption: Secondary | ICD-10-CM

## 2016-07-14 DIAGNOSIS — K219 Gastro-esophageal reflux disease without esophagitis: Secondary | ICD-10-CM

## 2016-07-14 DIAGNOSIS — E669 Obesity, unspecified: Secondary | ICD-10-CM

## 2016-07-14 NOTE — Progress Notes (Signed)
Subjective:    Patient ID: Susan Sullivan, female    DOB: Nov 05, 1975, 41 y.o.   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 She was initially seen on 04/25/2016.  After that visit she submitted the IBcause blood in stool sample. This revealed elevated levels of 7C4 indicative of bile acid diarrhea. We started her on colestipol 1-2 g daily. She's had a dramatic improvement in her diarrhea. She is very thankful for this response and is feeling much much better. When she uses 2 g every day she can go as long as 2 days without bowel movement and began to feel somewhat uncomfortable. For this reason she has been taking 1-2 g daily. With this she's having a bowel movement about once per day. She has gained weight and this stresses her out but she feels that this is because she is feeling better with the colestipol. She is interested in losing weight and has even considered gastric bypass surgery. She and her husband recently got a new elliptical machine and she hopes to start using this soon. 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 She is using Zofran on occasion for mild nausea. She's not had vomiting. She continues on Zantac which has been controlling her reflux. She is using B12 injections monthly.   Review of Systems As per history of present illness, otherwise negative  Current Medications, Allergies, Past Medical History, Past Surgical History, Family History and Social History were reviewed in [facility] record.     Objective:   Physical Exam BP 118/72   Pulse 86   Ht 5\' 7"  (1.702 m)   Wt 201 lb (91.2 kg)   BMI 31.48 kg/m  Constitutional: Well-developed and well-nourished. No distress. HEENT: Normocephalic and atraumatic.   Conjunctivae are normal.  No scleral icterus. Neck: Neck supple. Trachea midline. Cardiovascular: Normal rate, regular rhythm and intact distal pulses.   Pulmonary/chest: Effort normal and breath sounds normal. No wheezing, rales or rhonchi. Abdominal: Soft, nontender, nondistended. Bowel sounds active throughout. There are no masses palpable. No hepatosplenomegaly. Extremities: no clubbing, cyanosis, or edema  Neurological: Alert and oriented to person place and time. Skin: Skin is warm and dry.   Psychiatric: Normal mood and affect. Behavior is normal.  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  1. Chronic bile acid diarrhea -- much improved with colestipol. Continue cholesterol 1-2 g daily. Separate this medication from other medicines as absorption can be impaired  2. Pernicious anemia/B12 deficiency -- continue monthly IM B12  3. GERD -- continue Zantac 300 mg at bedtime.  4. Chronic nausea -- improved overall. Continue Zofran on an as-needed basis  5. Weight gain and obesity -- BMI is 31.5.  we discussed reasonable diet changes and I have recommended Weight Watchers. She does have multiple food allergies which makes certain diets difficult. We discussed how Weight Watchers as a high-fiber, low-fat somewhat calorie restricted diet which tends to have the best long-term results. Weight loss with this is usually slow but often more easily maintained. She  will look into this program.  Return in 6-9 months, sooner if necessary 25 minutes spent with the patient today. Greater than 50% was spent in counseling and coordination of care with the patient

## 2016-07-14 NOTE — Patient Instructions (Signed)
Continue colestipol 1-2 grams daily.  Continue zantac, zofran and b12 as directed.  Please follow up in 6-9 months with Dr Hilarie Fredrickson.  If you are age 41 or older, your body mass index should be between 23-30. Your Body mass index is 31.48 kg/m. If this is out of the aforementioned range listed, please consider follow up with your Primary Care Provider.  If you are age 31 or younger, your body mass index should be between 19-25. Your Body mass index is 31.48 kg/m. If this is out of the aformentioned range listed, please consider follow up with your Primary Care Provider.

## 2016-10-17 ENCOUNTER — Other Ambulatory Visit: Payer: Self-pay | Admitting: Internal Medicine

## 2016-10-20 ENCOUNTER — Ambulatory Visit
Admission: EM | Admit: 2016-10-20 | Discharge: 2016-10-20 | Disposition: A | Discharge status: Home / Self Care | Payer: Managed Care, Other (non HMO) | Attending: Internal Medicine | Admitting: Internal Medicine

## 2016-10-20 DIAGNOSIS — W57XXXA Bitten or stung by nonvenomous insect and other nonvenomous arthropods, initial encounter: Secondary | ICD-10-CM

## 2016-10-20 DIAGNOSIS — L03114 Cellulitis of left upper limb: Secondary | ICD-10-CM | POA: Diagnosis not present

## 2016-10-20 DIAGNOSIS — S50362A Insect bite (nonvenomous) of left elbow, initial encounter: Secondary | ICD-10-CM | POA: Diagnosis not present

## 2016-10-20 MED ORDER — SULFAMETHOXAZOLE-TRIMETHOPRIM 800-160 MG PO TABS: 1.0000 | ORAL_TABLET | Freq: Two times a day (BID) | ORAL | 0 refills | Status: DC

## 2016-10-20 MED ORDER — EPINEPHRINE 0.3 MG/0.3ML IJ SOAJ: 0.3000 mg | Freq: Once | INTRAMUSCULAR | 0 refills | Status: AC

## 2016-10-20 MED ORDER — CEPHALEXIN 500 MG PO CAPS: 500.0000 mg | ORAL_CAPSULE | Freq: Four times a day (QID) | ORAL | 0 refills | Status: DC

## 2016-10-20 MED ORDER — METHYLPREDNISOLONE 4 MG PO TBPK: ORAL_TABLET | ORAL | 0 refills | Status: DC

## 2016-10-20 MED ORDER — CEPHALEXIN 500 MG PO CAPS: 500.0000 mg | ORAL_CAPSULE | Freq: Four times a day (QID) | ORAL | 0 refills | Status: AC

## 2016-10-20 NOTE — ED Triage Notes (Signed)
Pt has insect bite on her left arm and it is swollen red and has a lot heat.

## 2016-10-20 NOTE — Discharge Instructions (Signed)
Rest,ice,elevate left arm. Take antibiotic as directed. Apply Bactroban ointment to affected area 2 x daily. Follow up with PCP in 2-3 days for wound check,sooner if worse.

## 2016-10-20 NOTE — ED Provider Notes (Signed)
CSN: [id number]     Arrival date & time 10/20/16  1800 History   None    Chief Complaint  Patient presents with  . Cellulitis    left arm   (Consider location/radiation/quality/duration/timing/severity/associated sxs/prior Treatment) 41 yr old caucasian female presents to UC with cc of worsening left elbow pain, swelling, erythema after mosquito bites, pt has extensive allergen hx including anaphalaxis.    The history is provided by the patient. No language interpreter was used.    Past Medical History:  Diagnosis Date  . 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 Chronic lower back pain   . Complication of anesthesia    ketamin - difficulty waking up  . Complication of anesthesia    doesn't metabolize gases well, stays in systems for days  . Degenerative disk disease    not on medication due to attempts to get pregant  . Dysrhythmia    tachycardia - sees dr. Fletcher Anon  . Ectopic pregnancy, tubal Sept 2012   left,  with rupture ,  and concurrent right ovarian cyst rupture  . Gestational diabetes    "only when I'm pregnant" (11/13/2012)  . MTHFR (methylene THF reductase) deficiency and homocystinuria ([place])    Past Surgical History:  Procedure Laterality Date  . AUGMENTATION MAMMAPLASTY Bilateral 2007  . CESAREAN SECTION  2002; 2010  . CHOLECYSTECTOMY  Dec 2012   [place], [place]  . DILATION AND CURETTAGE OF UTERUS  2012; 2013   x 4  . DILITATION & CURRETTAGE/HYSTROSCOPY WITH NOVASURE ABLATION N/A 06/21/2015   Procedure: DILATATION & CURETTAGE/HYSTEROSCOPY WITH NOVASURE ABLATION;  Surgeon: Brayton Mars, MD;  Location: [facility] ORS;  Service: Gynecology;  Laterality: N/A;  . LAPAROSCOPY FOR ECTOPIC PREGNANCY  2012  . POSTERIOR LUMBAR FUSION  11/13/2012  . SPINE SURGERY  2007   SI joint fusion [employer]  . SPINE SURGERY  June 2014   [facility]  L4-L5   Family History  Problem Relation Age of Onset  . Arthritis Mother   . Hyperlipidemia Mother    . Heart disease Mother   . Diabetes Mother   . Arthritis Father   . Hyperlipidemia Father   . Heart disease Father 90       CABG   . Diabetes Father   . Stroke Paternal Grandmother   . Heart disease Paternal Grandmother   . Heart disease Paternal Grandfather   . Cancer Neg Hx    Social History  Substance Use Topics  . Smoking status: Never Smoker  . Smokeless tobacco: Never Used  . Alcohol use No   OB History    Gravida Para Term Preterm AB Living   8 3 2   5 3    SAB TAB Ectopic Multiple Live Births   4   1   3      Review of Systems  Constitutional: Positive for activity change. Negative for fever.  HENT: Negative.   Eyes: Negative.   Respiratory: Negative.   Gastrointestinal: Negative.   Endocrine: Negative.   Genitourinary: Negative.   Musculoskeletal: Positive for joint swelling and myalgias.  Skin: Positive for color change and wound.  Allergic/Immunologic: Positive for environmental allergies.  Neurological: Negative.   Hematological: Negative.   All other systems reviewed and are negative.   Allergies  Clams [shellfish allergy]; Clindamycin/lincomycin; Dilaudid [hydromorphone]; Gabapentin; Ketamine; Latex; Nabumetone; Other; Peanut-containing drug products; Peanuts [peanut oil]; Spinach; Celebrex [celecoxib]; Adderall [amphetamine-dextroamphetamine]; Besivance [besifloxacin hcl]; Betadine [povidone iodine]; Cymbalta [duloxetine  hcl]; Eggs or egg-derived products; Meat extract; Nubain [nalbuphine hcl]; Propofol; Shellfish-derived products; Soy allergy; Sulfa antibiotics; Hydrocodone; Sudafed [pseudoephedrine hcl]; and Tape  Home Medications   Prior to Admission medications   Medication Sig Start Date End Date Taking? Authorizing Provider  aspirin 81 MG tablet Take 81 mg by mouth daily.   Yes [provider]  Biotin 5 MG CAPS Take by mouth.   Yes [provider]  cetirizine (ZYRTEC) 10 MG tablet Take 10 mg by mouth daily as needed for  allergies.  05/10/11  Yes Crecencio Mc, MD  Cholecalciferol (VITAMIN D-3) 1000 units CAPS Take by mouth 2 (two) times daily.    Yes [provider]  colestipol (COLESTID) 1 g tablet TAKE 2 TABLETS BY MOUTH ONCE EVERY MORNING 10/17/16  Yes Pyrtle, Lajuan Lines, MD  cyanocobalamin (,VITAMIN B-12,) 1000 MCG/ML injection Inject 1 ml IM once weekly x 4 weeks, then decrease to 1 ml IM once 2 weeks x 2 months, then decrease to once monthly thereafter 04/25/16  Yes Pyrtle, Lajuan Lines, MD  diphenhydrAMINE (BENADRYL) 25 MG tablet Take 25 mg by mouth every 6 (six) hours as needed for itching or allergies.   Yes [provider]  ibuprofen (ADVIL,MOTRIN) 800 MG tablet Take 800 mg by mouth every 8 (eight) hours as needed.   Yes [provider]  montelukast (SINGULAIR) 10 MG tablet Take 10 mg by mouth daily as needed.   Yes [provider]  albuterol (PROAIR HFA) 108 (90 BASE) MCG/ACT inhaler Inhale 2 puffs into the lungs every 6 (six) hours as needed for wheezing. 06/12/12   Crecencio Mc, MD  cephALEXin (KEFLEX) 500 MG capsule Take 1 capsule (500 mg total) by mouth 4 (four) times daily. 11/25/70 0/9/47  Felicia Bloomquist, Jeanett Schlein, NP  EPINEPHrine 0.3 mg/0.3 mL IJ SOAJ injection Inject 0.3 mLs (0.3 mg total) into the muscle once. 0/9/62 12/22/64  Tyleek Smick, Jeanett Schlein, NP  methylPREDNISolone (MEDROL DOSEPAK) 4 MG TBPK tablet Taper dose as manufacture labeled 06/30/45   Myra Weng, Jeanett Schlein, NP  ranitidine (ZANTAC) 300 MG tablet Take 1 tablet (300 mg total) by mouth at bedtime. Patient taking differently: Take 300 mg by mouth at bedtime. As needed 06/12/13 06/16/15  Crecencio Mc, MD  sulfamethoxazole-trimethoprim (BACTRIM DS,SEPTRA DS) 800-160 MG tablet Take 1 tablet by mouth 2 (two) times daily. 10/24/44   Safiyah Cisney, Jeanett Schlein, NP   Meds Ordered and Administered this Visit  Medications - No data to display  BP 138/82 (BP Location: Left Arm)   Pulse 84   Temp 98.4 F (36.9 C) (Oral)   Resp 18   Ht 5\' 7"   (1.702 m)   Wt 200 lb (90.7 kg)   SpO2 100%   BMI 31.32 kg/m  No data found.   Physical Exam  Constitutional: She is oriented to person, place, and time. She appears well-developed and well-nourished. She is active and cooperative. No distress.  HENT:  Head: Normocephalic and atraumatic.  Eyes: Conjunctivae are normal.  Neck: Neck supple.  Cardiovascular: Normal rate, regular rhythm, intact distal pulses and normal pulses.   No murmur heard. Pulses:      Radial pulses are 2+ on the right side, and 2+ on the left side.  Pulmonary/Chest: Effort normal and breath sounds normal. No respiratory distress.  Abdominal: Soft. There is no tenderness.  Musculoskeletal: She exhibits no edema.  Neurological: She is alert and oriented to person, place, and time. GCS eye subscore is 4. GCS verbal subscore is 5. GCS motor  subscore is 6.  Skin: Skin is warm and dry. Lesion noted. There is erythema.     Psychiatric: She has a normal mood and affect. Her speech is normal and behavior is normal.  Nursing note and vitals reviewed.   Urgent Care Course     Procedures (including critical care time)  Labs Review Labs Reviewed - No data to display  Imaging Review No results found.      MDM   1. Cellulitis of left elbow   2. Insect bite, initial encounter     Rest,ice,elevate left arm. Take antibiotic as directed. Apply Bactroban ointment to affected area 2 x daily. Follow up with PCP in 2-3 days for wound check,sooner if worse. Pt verbalized understanding to this provider. Scripted for Bactrim, Keflex, Medrol dose pk and epi pen d/t extensive allergen hx, no allergy to meds scripted today.    Tori Milks, NP 81/77/11 2104

## 2017-02-14 ENCOUNTER — Encounter: Payer: Self-pay | Admitting: Internal Medicine

## 2017-02-14 NOTE — Telephone Encounter (Signed)
Thank patient for her update I am okay refilling her 2 medications  I can see her back at the 2 year mark (from her last office visit) for continuity and go from there She can contact me with any new GI issues if this occurs before followup

## 2017-06-09 ENCOUNTER — Other Ambulatory Visit: Payer: Self-pay | Admitting: Internal Medicine

## 2017-06-19 IMAGING — MR MR CERVICAL SPINE W/O CM
5 series · 34 of 48 positions shown · non-contrast
Comparison: MRI head 08/20/2015.

CLINICAL DATA: Headaches with neck pain and bilateral arm numbness
for several months. No reported acute injury or prior relevant
surgery.

EXAM:
MRI CERVICAL SPINE WITHOUT CONTRAST
TECHNIQUE: Multiplanar, multisequence MR imaging of the cervical spine was
performed. No intravenous contrast was administered.

[Series 2: T2 · sagittal · 3.0mm · 0.56mm/px · 6 of 13 slices shown (1 of 2)]
[im 1/13]
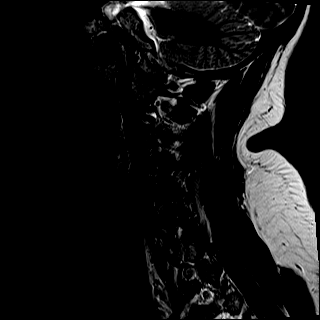
[im 3/13]
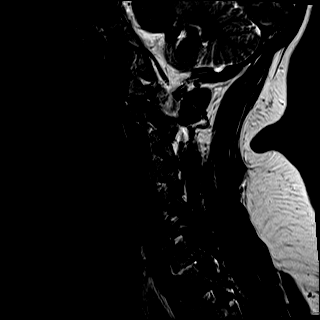
[im 5/13]
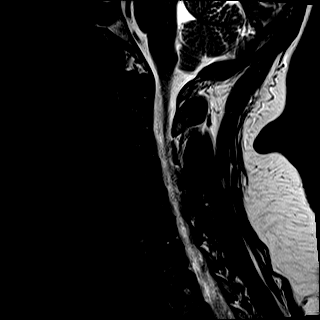
[im 8/13]
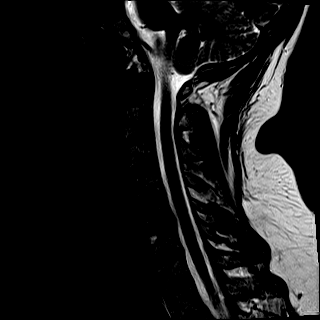
[im 10/13]
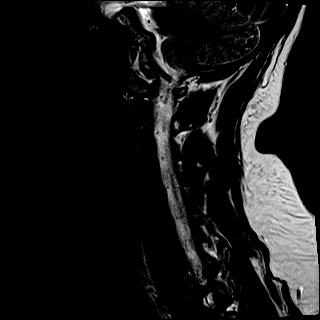
[im 13/13]
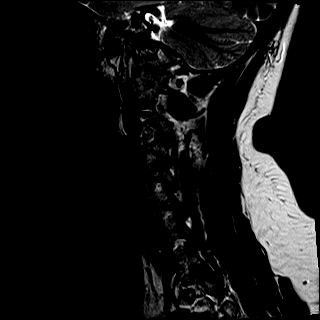

[Series 3: T1 · sagittal · 3.0mm · 0.70mm/px · 7 of 13 slices shown]
[im 1/13]
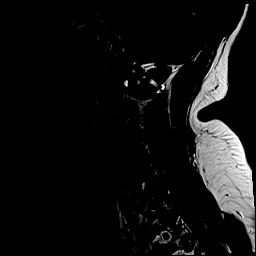
[im 3/13]
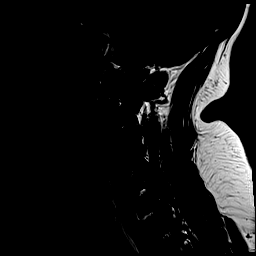
[im 5/13]
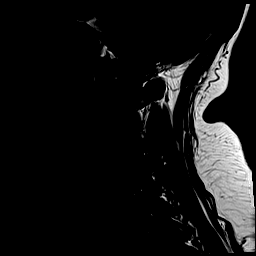
[im 7/13]
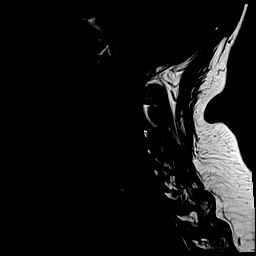
[im 9/13]
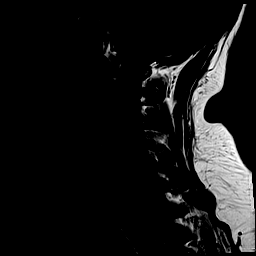
[im 11/13]
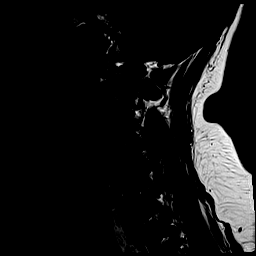
[im 13/13]
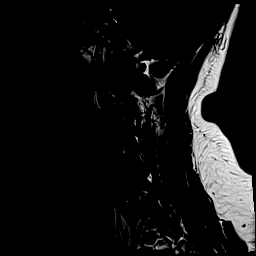

[Series 4: STIR · sagittal · 3.0mm · 0.35mm/px · 7 of 13 slices shown]
[im 1/13]
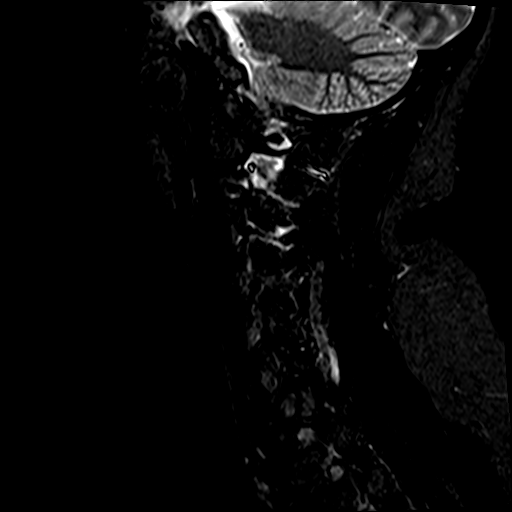
[im 3/13]
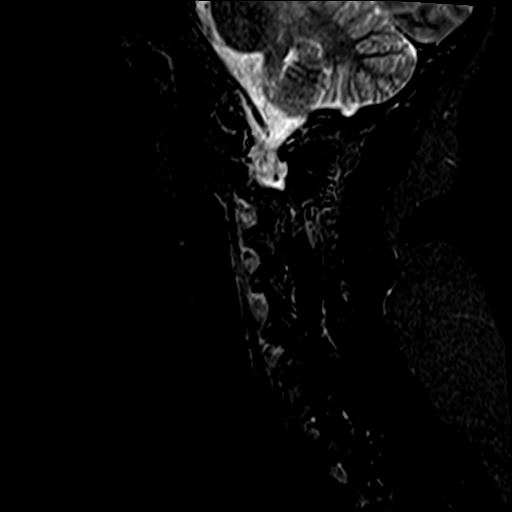
[im 5/13]
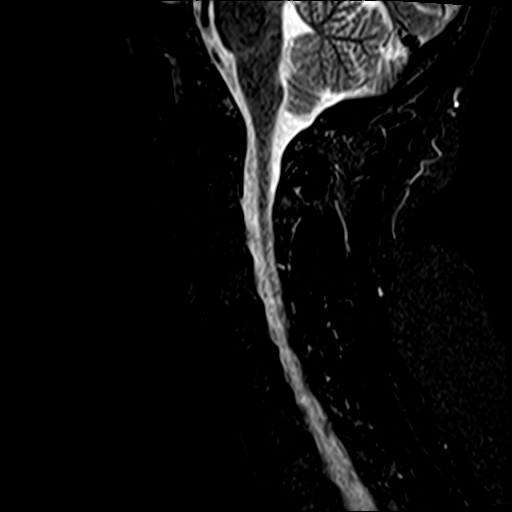
[im 7/13]
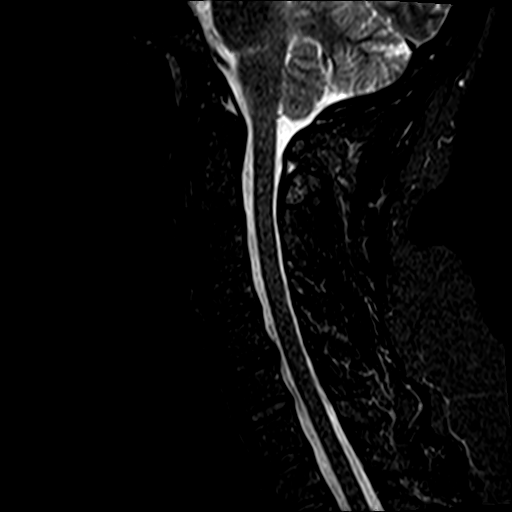
[im 9/13]
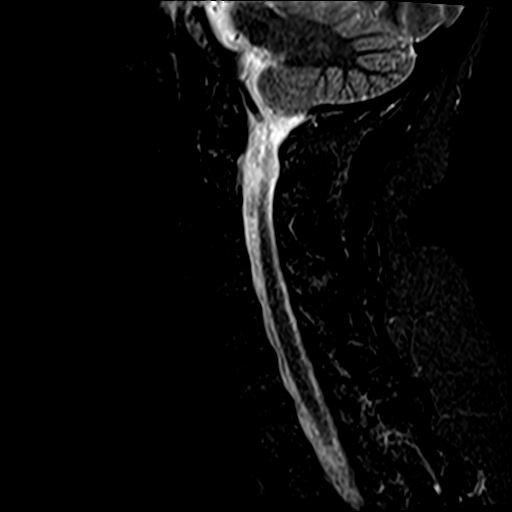
[im 11/13]
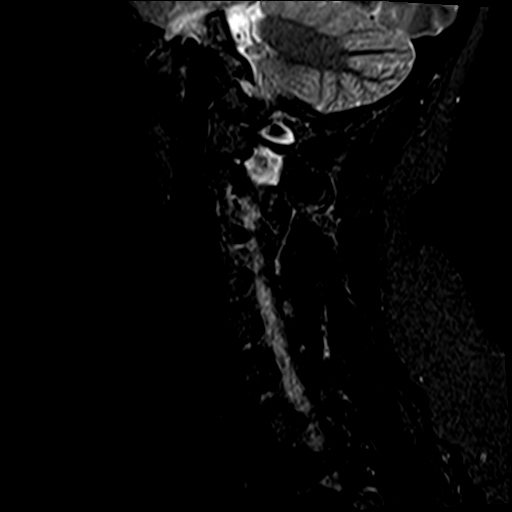
[im 13/13]
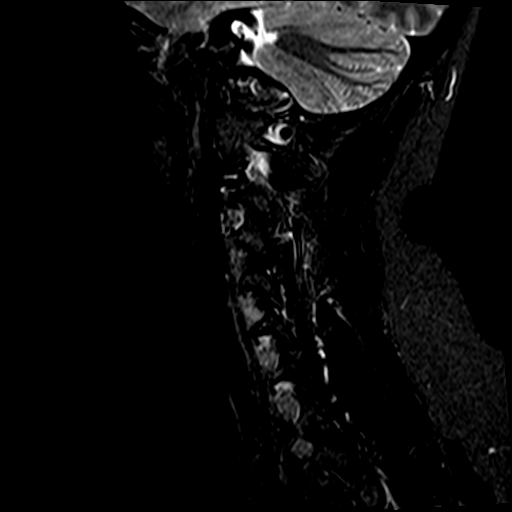

[Series 5: T2 · axial · 3.0mm · 0.62mm/px · z∈[-106,-4]mm · 8 of 28 slices shown (2 of 2)]
[im 1/28]
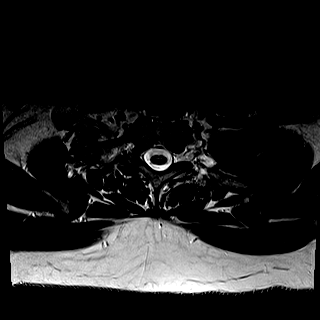
[im 5/28]
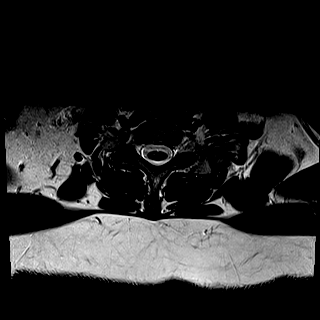
[im 9/28]
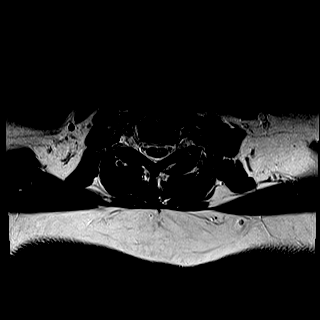
[im 13/28]
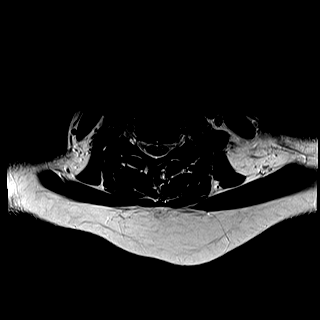
[im 15/28]
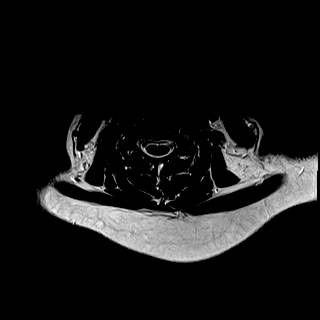
[im 19/28]
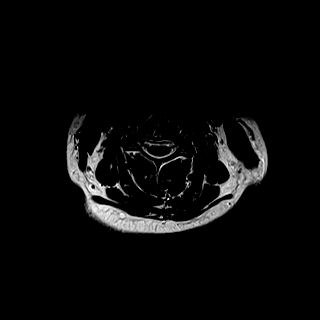
[im 23/28]
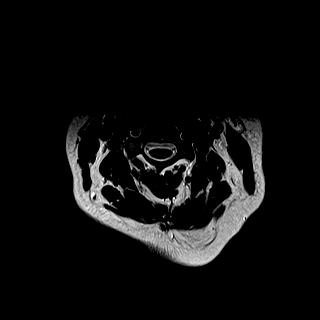
[im 28/28]
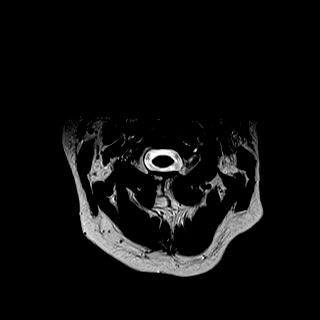

[Series 6: mpgr ax · axial · 3.0mm · 0.35mm/px · z∈[-97,-29]mm · 6 of 28 slices shown]
[im 1/28]
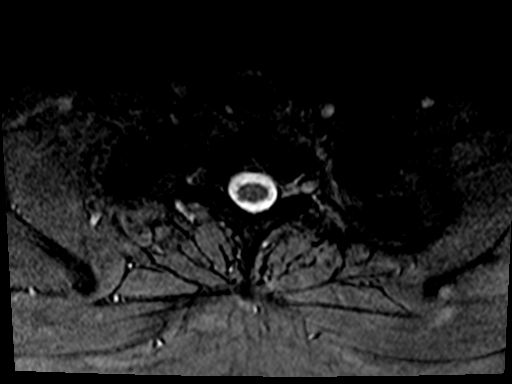
[im 5/28]
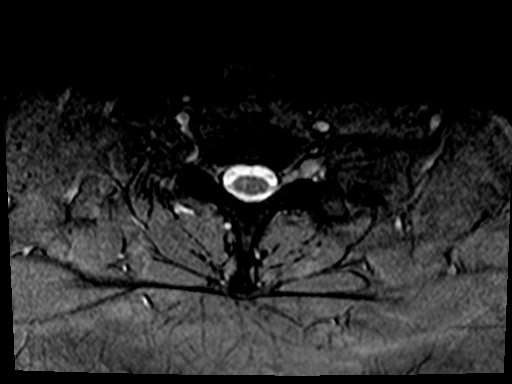
[im 9/28]
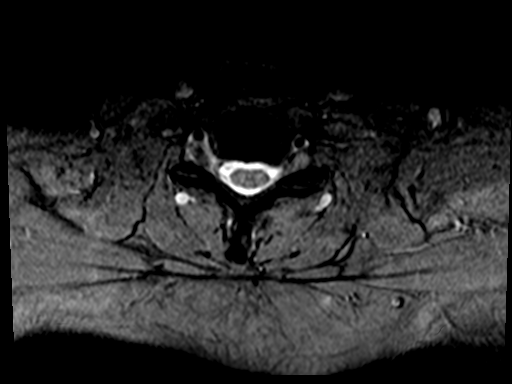
[im 13/28]
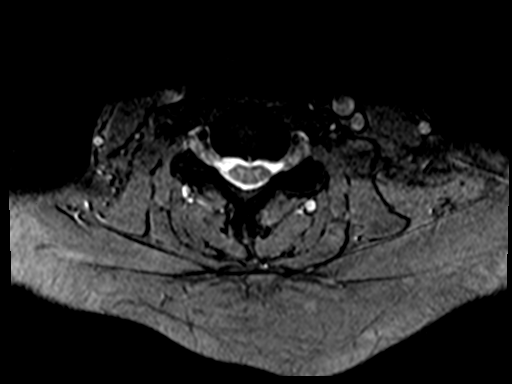
[im 15/28]
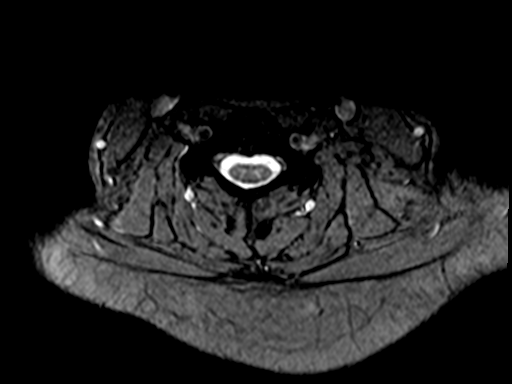
[im 19/28]
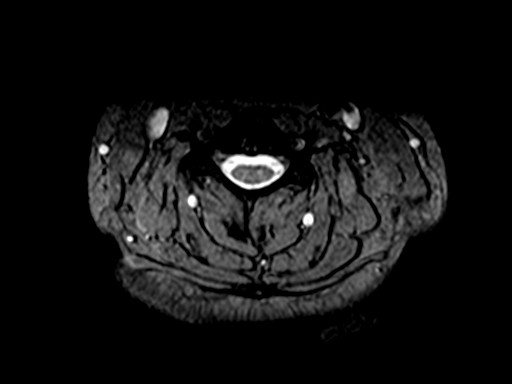

[34 of 48 positions shown; findings below may reference images not displayed]

FINDINGS: Alignment: Normal.

Vertebrae: No acute or suspicious osseous findings.

Cord: Normal in signal and caliber.

Posterior Fossa, vertebral arteries, paraspinal tissues: Visualized
portions of the posterior fossa and paraspinal soft tissues appear
unremarkable. Bilateral vertebral artery flow voids.

Disc levels:

There are no significant disc space findings at or above C4-5.

C5-6: Mild disc bulging with tiny central disc protrusion. No cord
deformity or foraminal compromise.

C6-7:  Normal interspace.

C7-T1:  Normal interspace.
IMPRESSION: 1. Near normal examination. No evidence of spinal stenosis or nerve
root encroachment.
2. Mild disc bulging and tiny disc protrusion at C5-6.

## 2018-09-11 ENCOUNTER — Encounter: Payer: Self-pay | Admitting: Internal Medicine

## 2020-07-14 ENCOUNTER — Other Ambulatory Visit: Payer: Self-pay

## 2020-07-14 ENCOUNTER — Encounter: Payer: Managed Care, Other (non HMO) | Admitting: Obstetrics and Gynecology

## 2020-07-22 ENCOUNTER — Other Ambulatory Visit (HOSPITAL_COMMUNITY)
Admission: RE | Admit: 2020-07-22 | Discharge: 2020-07-22 | Disposition: A | Discharge status: Home / Self Care | Payer: BC Managed Care – PPO | Source: Ambulatory Visit | Attending: Obstetrics and Gynecology | Admitting: Obstetrics and Gynecology

## 2020-07-22 ENCOUNTER — Ambulatory Visit: Payer: BC Managed Care – PPO | Admitting: Obstetrics and Gynecology

## 2020-07-22 ENCOUNTER — Other Ambulatory Visit: Payer: Self-pay

## 2020-07-22 ENCOUNTER — Encounter: Payer: Self-pay | Admitting: Obstetrics and Gynecology

## 2020-07-22 VITALS — BP 139/87 | HR 85 | Ht 67.0 in | Wt 212.6 lb

## 2020-07-22 DIAGNOSIS — Z01419 Encounter for gynecological examination (general) (routine) without abnormal findings: Secondary | ICD-10-CM

## 2020-07-22 DIAGNOSIS — Z1231 Encounter for screening mammogram for malignant neoplasm of breast: Secondary | ICD-10-CM | POA: Diagnosis not present

## 2020-07-22 NOTE — Progress Notes (Signed)
HPI:      Ms. Susan Sullivan is a 45 y.o. [phone] who LMP was No LMP recorded (lmp unknown). Patient has had an ablation.  Subjective:   She presents today for her annual examination.  She has not had a Pap smear or mammogram in many years because of insurance issues.  She would like to have these tests performed/ordered. Patient has previously had an endometrial ablation.  She is not having menses. She has no GYN complaints. Please see medical history below-patient has had immunologic and neurologic issues as well as multiple spine surgeries. She has significant pain in her left breast where she has an implant.    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 She does not have any pertinent problems on file. She  has a past surgical history that includes Cesarean section (2002; 2010); Cholecystectomy (Dec 2012); Posterior lumbar fusion (11/13/2012); Augmentation mammaplasty (Bilateral, 2007); Laparoscopy for ectopic pregnancy (2012); Spine surgery (2007); Spine surgery (June 2014); Dilation and curettage of uterus (2012; 2013); and Dilatation & currettage/hysteroscopy with novasure ablation (N/A, 06/21/2015). Her family history includes Arthritis in her father and mother; Diabetes in her father and mother; Heart disease in her mother, paternal grandfather, and paternal grandmother; Heart disease (age of onset: 47) in her father; Hyperlipidemia in her father and mother; Stroke in her paternal grandmother. She  reports that she has never smoked. She has never used smokeless tobacco. She  reports that she does not drink alcohol and does not use drugs. She has a current medication list which includes the following prescription(s): albuterol, aspirin, biotin, cetirizine, vitamin d-3, diphenhydramine, famotidine, hydroxychloroquine, ibuprofen, modafinil, montelukast, valacyclovir, and cyanocobalamin. She is allergic to clams [shellfish allergy], clindamycin/lincomycin, dilaudid [hydromorphone], gabapentin, ketamine, latex, nabumetone, other, peanut-containing drug products, peanuts [peanut oil], spinach, celebrex [celecoxib], adderall [amphetamine-dextroamphetamine], besivance [besifloxacin hcl], betadine [povidone iodine], cymbalta [duloxetine hcl], eggs or egg-derived products, meat extract, nubain [nalbuphine hcl], propofol, shellfish-derived products, soy allergy, sulfa antibiotics, hydrocodone, sudafed [pseudoephedrine hcl], and tape.       Review of Systems:  Review of Systems  Constitutional: Denied constitutional symptoms, night sweats, recent illness, fatigue, fever, insomnia and weight loss.  Eyes: Denied eye symptoms, eye pain, photophobia, vision change and visual disturbance.  Ears/Nose/Throat/Neck: Denied ear, nose, throat or neck symptoms, hearing loss, nasal discharge, sinus congestion and sore throat.  Cardiovascular: Denied cardiovascular symptoms, arrhythmia, chest pain/pressure, edema, exercise intolerance, orthopnea and palpitations.  Respiratory: Denied pulmonary symptoms, asthma, pleuritic pain, productive sputum, cough, dyspnea and wheezing.  Gastrointestinal: Denied, gastro-esophageal reflux, melena, nausea and vomiting.  Genitourinary: Denied genitourinary symptoms including symptomatic vaginal discharge, pelvic relaxation issues, and urinary complaints.  Musculoskeletal:  Multiple orthopedic issues with previous fusions  Dermatologic: Denied dermatology symptoms, rash and scar.  Neurologic: See HPI for additional information.  Patient complains of occasional  "brain fog"  Psychiatric: Denied psychiatric symptoms, anxiety and depression.  Endocrine: Denied endocrine symptoms including hot flashes and night sweats.   Meds:   Current Outpatient Medications on File Prior to Visit  Medication Sig Dispense Refill  . albuterol (PROAIR HFA) 108 (90 BASE) MCG/ACT inhaler Inhale 2 puffs  into the lungs every 6 (six) hours as needed for wheezing. 6.7 g 11  . aspirin 81 MG tablet Take 81 mg by mouth daily.    . Biotin 5 MG CAPS Take by mouth.    . cetirizine (ZYRTEC) 10 MG tablet Take 10 mg by mouth daily as needed for allergies.     . Cholecalciferol (VITAMIN D-3) 1000 units CAPS Take by mouth 2 (two) times daily.     . diphenhydrAMINE (BENADRYL) 25 MG tablet Take 25 mg by mouth every 6 (six) hours as needed for itching or allergies.    . famotidine (PEPCID) 20 MG tablet Take 20 mg by mouth 2 (two) times daily.    . hydroxychloroquine (PLAQUENIL) 200 MG tablet Take by mouth.    [name] ibuprofen (ADVIL,MOTRIN) 800 MG tablet Take 800 mg by mouth every 8 (eight) hours as needed.    . modafinil (PROVIGIL) 200 MG tablet Take 200 mg by mouth daily.    . montelukast (SINGULAIR) 10 MG tablet Take 10 mg by mouth daily as needed.    . valACYclovir (VALTREX) 500 MG tablet Take 500 mg by mouth 2 (two) times daily.    . cyanocobalamin (,VITAMIN B-12,) 1000 MCG/ML injection Inject 1 ml IM once weekly x 4 weeks, then decrease to 1 ml IM once 2 weeks x 2 months, then decrease to once monthly thereafter 10 mL 1   No current facility-administered medications on file prior to visit.          Objective:     Vitals:   07/22/20 0929  BP: 139/87  Pulse: 85    Filed Weights   07/22/20 0929  Weight: 212 lb 9.6 oz (96.4 kg)              Physical examination General NAD, Conversant  HEENT Atraumatic; Op clear with mmm.  Normo-cephalic. Pupils reactive. Anicteric sclerae  Thyroid/Neck Smooth without nodularity or enlargement. Normal ROM.  Neck Supple.  Skin No rashes,  lesions or ulceration. Normal palpated skin turgor. No nodularity.  Breasts: No masses or discharge.  Symmetric.  No axillary adenopathy.left-sided implant -bilateral scars from prior surgery  Lungs: Clear to auscultation.No rales or wheezes. Normal Respiratory effort, no retractions.  Heart: NSR.  No murmurs or rubs appreciated. No periferal edema  Abdomen: Soft.  Non-tender.  No masses.  No HSM. No hernia  Extremities: Moves all appropriately.  Some issues with right lower extremity secondary to previous fusion normal ROM for age. No lymphadenopathy.  Neuro: Oriented to PPT.  Normal mood. Normal affect.     Pelvic:   Vulva: Normal appearance.  No lesions.  Vagina: No lesions or abnormalities noted.  Support: Normal pelvic support.  Urethra No masses tenderness or scarring.  Meatus Normal size without lesions or prolapse.  Cervix: Normal appearance.  No lesions.  Anus: Normal exam.  No lesions.  Perineum: Normal exam.  No lesions.        Bimanual   Uterus: Normal size.  Non-tender.  Mobile.  AV.  Adnexae: No masses.  Non-tender to palpation.  Cul-de-sac: Negative for abnormality.     Assessment:    [id number] Patient Active Problem List   Diagnosis Date Noted  . Status post endometrial ablation 06/29/2015  . 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 Habitual aborter, currently pregnant 01/19/2014  . Poor social situation 01/19/2014  . Sinusitis, acute frontal 06/29/2013  . Irritable bowel syndrome 06/03/2013  . Abdominal pain, other specified site 05/19/2013  .  Hypokalemia 01/23/2013  . Anxiety state, unspecified 08/06/2012  . Tachycardia 08/06/2012  . 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 Hypercoagulable state ([place]) 05/16/2012  . Infectious mononucleosis 03/29/2012  . Colicky RUQ abdominal pain 02/25/2012  . Back pain, thoracic 02/23/2012  . Elevated blood pressure reading without diagnosis of hypertension 01/07/2012  .  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 Oral allergy syndrome 05/14/2011  . Pre-conception counseling 05/14/2011  . Lipids abnormal 05/14/2011  . 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 History of ectopic pregnancy   . Spinal stenosis of lumbar region      1. Well woman exam with routine gynecological exam   2. Encounter for screening mammogram for malignant neoplasm of breast        Plan:            1.  Basic Screening Recommendations The basic screening recommendations for asymptomatic women were discussed with the patient during her visit.  The age-appropriate recommendations were discussed with her and the rational for the tests reviewed.  When I am informed by the patient that another primary care physician has previously obtained the age-appropriate tests and they are up-to-date, only outstanding tests are ordered and referrals given as necessary.  Abnormal results of tests will be discussed with her when all of her results are completed.  Routine preventative health maintenance measures emphasized: Exercise/Diet/Weight control, Tobacco Warnings, Alcohol/Substance use risks and Stress Management Pap performed-mammogram ordered Orders No orders of the defined types were placed in this encounter.   No orders of the defined types were placed in this encounter.         F/U  Return in about 1 year (around 07/22/2021) for Annual Physical.  Finis Bud, M.D. 07/22/2020 10:04 AM

## 2020-07-26 LAB — CYTOLOGY - PAP
Comment: NEGATIVE
Diagnosis: NEGATIVE
High risk HPV: NEGATIVE

## 2021-09-07 ENCOUNTER — Telehealth: Payer: Self-pay | Admitting: Internal Medicine

## 2021-09-07 NOTE — Telephone Encounter (Signed)
Good Morning Dr. Hilarie Fredrickson, ? ? ?Patient called wanting to make an appointment with you. After looking at patients chart she was last seen in 2019 by [facility]. Patient stated that her PCP referred her to them and she was not pleased with her care. Patients record from that [facility] is in [place], will you please review and advise on scheduling? ? ? ?Thank you.  ?

## 2021-09-07 NOTE — Telephone Encounter (Signed)
Request received to transfer GI care from outside practice to [facility].  We appreciate the interest in our practice, however at this time due to high demand from patients without established GI providers we cannot accommodate this transfer.  Ability to accommodate future transfer requests may change over time and the patient can contact us again in 6-12 months if still interested in being seen at [facility].      °

## 2021-09-13 NOTE — Telephone Encounter (Signed)
LVM for patient, advising on recommendations.   ?

## 2022-09-07 ENCOUNTER — Other Ambulatory Visit: Payer: Self-pay | Admitting: Emergency Medicine

## 2022-09-07 DIAGNOSIS — M67911 Unspecified disorder of synovium and tendon, right shoulder: Secondary | ICD-10-CM

## 2022-09-14 ENCOUNTER — Ambulatory Visit
Admission: RE | Admit: 2022-09-14 | Discharge: 2022-09-14 | Disposition: A | Discharge status: Home / Self Care | Payer: BC Managed Care – PPO | Source: Ambulatory Visit | Attending: Emergency Medicine | Admitting: Emergency Medicine

## 2022-09-14 DIAGNOSIS — M67911 Unspecified disorder of synovium and tendon, right shoulder: Secondary | ICD-10-CM

## 2022-09-21 ENCOUNTER — Other Ambulatory Visit: Payer: BC Managed Care – PPO

## 2022-09-27 NOTE — H&P (Signed)
Pre-Procedure H&P   Patient ID: Adriona Latorria Muralles is a 47 y.o. female.  Gastroenterology Provider: Jaynie Collins, DO  Referring Provider: Tawni Pummel, PA PCP: Danella Penton, MD  Date: 09/28/2022  HPI Ms. Krystl Marsela Cokley is a 47 y.o. female who presents today for Esophagogastroduodenoscopy and Colonoscopy for GERD, dysphagia, colorectal screening .  Patient with longstanding chronic diarrhea.  Previous biopsies negative for microcolitis and December 2013.  Normal TI at that time as well.  She was started on naltrexone and chronic diarrhea improved from 6-8 bowel movements a day to bowel movement every other day.  Patient has noted globus sensation and frequent throat clearing.  She notes some pain with swallowing but denies outright dysphagia.  This is improved with 40 mg twice a day famotidine. Also, started on twice a day protonix which also has improved sx.  EGD in 2013 was normal  Patient with history of MTHFR mutation. Patient takes 800 mg of ibuprofen at least daily.  She is status post cholecystectomy and C-section.  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 Kirke Corin   Ectopic pregnancy, tubal 01/21/2011   left,  with rupture ,  and concurrent right ovarian cyst rupture   Gestational diabetes    "only when I'm pregnant" (11/13/2012)   Mast cell disorder    MTHFR (methylene THF reductase) deficiency and homocystinuria ([facility])     Past Surgical History:  Procedure Laterality Date   AUGMENTATION MAMMAPLASTY Bilateral 2007   CESAREAN SECTION   2002; 2010   CHOLECYSTECTOMY  Dec 2012   [facility], [facility]   DILATION AND CURETTAGE OF UTERUS  2012; 2013   x 4   DILITATION & CURRETTAGE/HYSTROSCOPY WITH NOVASURE ABLATION N/A 06/21/2015   Procedure: DILATATION & CURETTAGE/HYSTEROSCOPY WITH NOVASURE ABLATION;  Surgeon: Herold Harms, MD;  Location: [facility] ORS;  Service: Gynecology;  Laterality: N/A;   LAPAROSCOPY FOR ECTOPIC PREGNANCY  2012   POSTERIOR LUMBAR FUSION  11/13/2012   SPINE SURGERY  2007   SI joint fusion [facility] SURGERY  June 2014   Delma Officer  L4-L5    Family History No h/o GI disease or malignancy  Review of Systems  Constitutional:  Negative for activity change, appetite change, chills, diaphoresis, fatigue, fever and unexpected weight change.  HENT:  Positive for trouble swallowing. Negative for voice change.   Respiratory:  Negative for shortness of breath and wheezing.   Cardiovascular:  Negative for chest pain, palpitations and leg swelling.  Gastrointestinal:  Positive for diarrhea. Negative for abdominal distention, abdominal pain, anal bleeding, blood in stool, constipation, nausea, rectal pain and vomiting.       Chronic throat clearing, globus sensation  Musculoskeletal:  Negative for arthralgias and myalgias.  Skin:  Negative for color change and pallor.  Neurological:  Negative for dizziness, syncope and weakness.  Psychiatric/Behavioral:  Negative for confusion.   All other systems reviewed and are negative.    Medications No current facility-administered  medications on file prior to encounter.   Current Outpatient Medications on File Prior to Encounter  Medication Sig Dispense Refill   buPROPion (WELLBUTRIN XL) 150 MG 24 hr tablet Take 150 mg by mouth 2 (two) times daily.     cyanocobalamin (,VITAMIN B-12,) 1000 MCG/ML injection Inject 1 ml IM once weekly x 4 weeks, then decrease to 1 ml IM once 2 weeks x 2 months, then decrease to once monthly thereafter 10 mL 1    famotidine (PEPCID) 20 MG tablet Take 20 mg by mouth 2 (two) times daily.     ibuprofen (ADVIL,MOTRIN) 800 MG tablet Take 800 mg by mouth every 8 (eight) hours as needed.     montelukast (SINGULAIR) 10 MG tablet Take 10 mg by mouth daily as needed.     Naloxone HCl (NARCAN NA) Take 16 mg by mouth in the morning and at bedtime.     valACYclovir (VALTREX) 500 MG tablet Take 500 mg by mouth 2 (two) times daily.     albuterol (PROAIR HFA) 108 (90 BASE) MCG/ACT inhaler Inhale 2 puffs into the lungs every 6 (six) hours as needed for wheezing. 6.7 g 11   aspirin 81 MG tablet Take 81 mg by mouth daily.     Biotin 5 MG CAPS Take by mouth.     cetirizine (ZYRTEC) 10 MG tablet Take 10 mg by mouth daily as needed for allergies.      Cholecalciferol (VITAMIN D-3) 1000 units CAPS Take by mouth 2 (two) times daily.      diphenhydrAMINE (BENADRYL) 25 MG tablet Take 25 mg by mouth every 6 (six) hours as needed for itching or allergies.     hydroxychloroquine (PLAQUENIL) 200 MG tablet Take by mouth.     modafinil (PROVIGIL) 200 MG tablet Take 200 mg by mouth daily.      Pertinent medications related to GI and procedure were reviewed by me with the patient prior to the procedure   Current Facility-Administered Medications:    0.9 %  sodium chloride infusion, , Intravenous, Continuous, Jaynie Collins, DO, Last Rate: 20 mL/hr at 09/28/22 0755, New Bag at 09/28/22 0755  sodium chloride 20 mL/hr at 09/28/22 0755       Allergies  Allergen Reactions   Clams [Shellfish Allergy] Nausea And Vomiting   Clindamycin/Lincomycin Anaphylaxis   Dilaudid [Hydromorphone] Anaphylaxis    Patient worried it caused anaphylaxis   Gabapentin Other (See Comments)    agitation Hyperacitivity.    Ketamine Other (See Comments)    Doesn't wake up   Latex Anaphylaxis, Itching and Rash   Nabumetone     Hallucinations, felt like she could kill herself   Other Anaphylaxis    Peanut allergy, silk tape Narcotics-rash,  itching; all nuts; peas; lemons (anaphylaxis); melon anesthesia gases   Peanut-Containing Drug Products Anaphylaxis   Peanuts [Peanut Oil] Anaphylaxis   Spinach Nausea And Vomiting   Celebrex [Celecoxib] Other (See Comments)    agitation Insomnia    Adderall [Amphetamine-Dextroamphetamine] Other (See Comments)    violent   Besivance [Besifloxacin Hcl]    Betadine [Povidone Iodine]    Cymbalta [Duloxetine Hcl] Other (See Comments)    Hyperactivity.    Egg-Derived Products     Also raw fruits/veggies   Meat Extract Nausea And Vomiting    Red meat and pork.    Nubain [Nalbuphine Hcl] Itching   Propofol Diarrhea and Other (See Comments)    Allergic to Soy Never had issue with prior surgeries receiving this  Shellfish-Derived Products Other (See Comments)    Positive on RAZ testing   Soy Allergy Other (See Comments)    GI upset; avoids propofal   Sulfa Antibiotics Other (See Comments)   Hydrocodone Rash   Sudafed [Pseudoephedrine Hcl] Other (See Comments) and Palpitations    palpatations Tachycardia    Tape Rash    OK to use paper tape for short periods of time.   Allergies were reviewed by me prior to the procedure  Objective   Body mass index is 30.01 kg/m. Vitals:   09/28/22 0745  Pulse: 77  Resp: 18  Temp: (!) 96.4 F (35.8 C)  TempSrc: Temporal  Weight: 86.9 kg  Height: 5\' 7"  (1.702 m)     Physical Exam Vitals and nursing note reviewed.  Constitutional:      General: She is not in acute distress.    Appearance: Normal appearance. She is not ill-appearing, toxic-appearing or diaphoretic.  HENT:     Head: Normocephalic and atraumatic.     Nose: Nose normal.     Mouth/Throat:     Mouth: Mucous membranes are moist.     Pharynx: Oropharynx is clear.  Eyes:     General: No scleral icterus.    Extraocular Movements: Extraocular movements intact.  Cardiovascular:     Rate and Rhythm: Normal rate and regular rhythm.     Heart sounds: Normal heart sounds.  No murmur heard.    No friction rub. No gallop.  Pulmonary:     Effort: Pulmonary effort is normal. No respiratory distress.     Breath sounds: Normal breath sounds. No wheezing, rhonchi or rales.  Abdominal:     General: Bowel sounds are normal. There is no distension.     Palpations: Abdomen is soft.     Tenderness: There is no abdominal tenderness. There is no guarding or rebound.  Musculoskeletal:     Cervical back: Neck supple.     Right lower leg: No edema.     Left lower leg: No edema.  Skin:    General: Skin is warm and dry.     Coloration: Skin is not jaundiced or pale.  Neurological:     General: No focal deficit present.     Mental Status: She is alert and oriented to person, place, and time. Mental status is at baseline.  Psychiatric:        Mood and Affect: Mood normal.        Behavior: Behavior normal.        Thought Content: Thought content normal.        Judgment: Judgment normal.      Assessment:  Ms. Fraidy Scarpa is a 47 y.o. female  who presents today for Esophagogastroduodenoscopy and Colonoscopy for GERD, dysphagia, colorectal screening .  Plan:  Esophagogastroduodenoscopy and Colonoscopy with possible intervention today  Esophagogastroduodenoscopy and Colonoscopy with possible biopsy, control of bleeding, polypectomy, and interventions as necessary has been discussed with the patient/patient representative. Informed consent was obtained from the patient/patient representative after explaining the indication, nature, and risks of the procedure including but not limited to death, bleeding, perforation, missed neoplasm/lesions, cardiorespiratory compromise, and reaction to medications. Opportunity for questions was given and appropriate answers were provided. Patient/patient representative has verbalized understanding is amenable to undergoing the procedure.   Jaynie Collins, DO  [facility]  Portions of the record  may have been created with voice recognition software. Occasional wrong-word or 'sound-a-like' substitutions may have occurred due to the  inherent limitations of voice recognition software.  Read the chart carefully and recognize, using context, where substitutions may have occurred.

## 2022-09-28 ENCOUNTER — Ambulatory Visit: Payer: BC Managed Care – PPO | Admitting: Certified Registered"

## 2022-09-28 ENCOUNTER — Other Ambulatory Visit: Payer: Self-pay

## 2022-09-28 ENCOUNTER — Ambulatory Visit
Admission: RE | Admit: 2022-09-28 | Discharge: 2022-09-28 | Disposition: A | Discharge status: Home / Self Care | Payer: BC Managed Care – PPO | Attending: Gastroenterology | Admitting: Gastroenterology

## 2022-09-28 ENCOUNTER — Encounter
Admission: RE | Disposition: A | Discharge status: Home / Self Care | Payer: Self-pay | Source: Home / Self Care | Attending: Gastroenterology

## 2022-09-28 ENCOUNTER — Encounter: Payer: Self-pay | Admitting: Gastroenterology

## 2022-09-28 DIAGNOSIS — K644 Residual hemorrhoidal skin tags: Secondary | ICD-10-CM | POA: Insufficient documentation

## 2022-09-28 DIAGNOSIS — D122 Benign neoplasm of ascending colon: Secondary | ICD-10-CM | POA: Insufficient documentation

## 2022-09-28 DIAGNOSIS — R000 Tachycardia, unspecified: Secondary | ICD-10-CM | POA: Insufficient documentation

## 2022-09-28 DIAGNOSIS — E7211 Homocystinuria: Secondary | ICD-10-CM | POA: Diagnosis not present

## 2022-09-28 DIAGNOSIS — F419 Anxiety disorder, unspecified: Secondary | ICD-10-CM | POA: Insufficient documentation

## 2022-09-28 DIAGNOSIS — R1310 Dysphagia, unspecified: Secondary | ICD-10-CM | POA: Insufficient documentation

## 2022-09-28 DIAGNOSIS — M545 Low back pain, unspecified: Secondary | ICD-10-CM | POA: Diagnosis not present

## 2022-09-28 DIAGNOSIS — E669 Obesity, unspecified: Secondary | ICD-10-CM | POA: Diagnosis not present

## 2022-09-28 DIAGNOSIS — K5730 Diverticulosis of large intestine without perforation or abscess without bleeding: Secondary | ICD-10-CM | POA: Insufficient documentation

## 2022-09-28 DIAGNOSIS — G8929 Other chronic pain: Secondary | ICD-10-CM | POA: Diagnosis not present

## 2022-09-28 DIAGNOSIS — E7212 Methylenetetrahydrofolate reductase deficiency: Secondary | ICD-10-CM | POA: Insufficient documentation

## 2022-09-28 DIAGNOSIS — D125 Benign neoplasm of sigmoid colon: Secondary | ICD-10-CM | POA: Diagnosis not present

## 2022-09-28 DIAGNOSIS — K640 First degree hemorrhoids: Secondary | ICD-10-CM | POA: Diagnosis not present

## 2022-09-28 DIAGNOSIS — Z6830 Body mass index (BMI) 30.0-30.9, adult: Secondary | ICD-10-CM | POA: Diagnosis not present

## 2022-09-28 DIAGNOSIS — E119 Type 2 diabetes mellitus without complications: Secondary | ICD-10-CM | POA: Diagnosis not present

## 2022-09-28 DIAGNOSIS — Z1211 Encounter for screening for malignant neoplasm of colon: Secondary | ICD-10-CM | POA: Diagnosis not present

## 2022-09-28 DIAGNOSIS — K219 Gastro-esophageal reflux disease without esophagitis: Secondary | ICD-10-CM | POA: Insufficient documentation

## 2022-09-28 HISTORY — PX: ESOPHAGOGASTRODUODENOSCOPY: SHX5428

## 2022-09-28 HISTORY — DX: Other mast cell neoplasms of uncertain behavior: D47.09

## 2022-09-28 HISTORY — PX: COLONOSCOPY: SHX5424

## 2022-09-28 SURGERY — COLONOSCOPY
Anesthesia: General

## 2022-09-28 MED ORDER — SODIUM CHLORIDE 0.9 % IV SOLN: INTRAVENOUS | Status: DC

## 2022-09-28 MED ORDER — LIDOCAINE HCL (CARDIAC) PF 100 MG/5ML IV SOSY
PREFILLED_SYRINGE | INTRAVENOUS | Status: DC | PRN
  Administered 2022-09-28: 100 mg via INTRAVENOUS

## 2022-09-28 MED ORDER — DIPHENHYDRAMINE HCL 50 MG/ML IJ SOLN
INTRAMUSCULAR | Status: DC | PRN
  Administered 2022-09-28: 25 mg via INTRAVENOUS

## 2022-09-28 MED ORDER — PROPOFOL 1000 MG/100ML IV EMUL
INTRAVENOUS | Status: AC
  Filled 2022-09-28: qty 100

## 2022-09-28 MED ORDER — PROPOFOL 500 MG/50ML IV EMUL
INTRAVENOUS | Status: DC | PRN
  Administered 2022-09-28: 150 ug/kg/min via INTRAVENOUS

## 2022-09-28 MED ORDER — PROPOFOL 10 MG/ML IV BOLUS
INTRAVENOUS | Status: DC | PRN
  Administered 2022-09-28: 70 mg via INTRAVENOUS
  Administered 2022-09-28 (×3): 10 mg via INTRAVENOUS

## 2022-09-28 NOTE — Interval H&P Note (Signed)
History and Physical Interval Note: Preprocedure H&P from 09/28/22  was reviewed and there was no interval change after seeing and examining the patient.  Written consent was obtained from the patient after discussion of risks, benefits, and alternatives. Patient has consented to proceed with Esophagogastroduodenoscopy and Colonoscopy with possible intervention    09/28/2022 8:15 AM  Susan Sullivan  has presented today for surgery, with the diagnosis of Colon cancer screening (Z12.11) Gastroesophageal reflux disease, unspecified whether esophagitis present (K21.9).  The various methods of treatment have been discussed with the patient and family. After consideration of risks, benefits and other options for treatment, the patient has consented to  Procedure(s): COLONOSCOPY (N/A) ESOPHAGOGASTRODUODENOSCOPY (EGD) (N/A) as a surgical intervention.  The patient's history has been reviewed, patient examined, no change in status, stable for surgery.  I have reviewed the patient's chart and labs.  Questions were answered to the patient's satisfaction.     Jaynie Collins

## 2022-09-28 NOTE — Transfer of Care (Signed)
Immediate Anesthesia Transfer of Care Note  Patient: Susan Sullivan  Procedure(s) Performed: COLONOSCOPY ESOPHAGOGASTRODUODENOSCOPY (EGD)  Patient Location: PACU and Endoscopy Unit  Anesthesia Type:General  Level of Consciousness: awake  Airway & Oxygen Therapy: Patient Spontanous Breathing  Post-op Assessment: Report given to RN and Post -op Vital signs reviewed and stable  Post vital signs: Reviewed and stable  Last Vitals:  Vitals Value Taken Time  BP    Temp    Pulse    Resp    SpO2      Last Pain:  Vitals:   09/28/22 0745  TempSrc: Temporal  PainSc: 0-No pain         Complications: No notable events documented.

## 2022-09-28 NOTE — Anesthesia Postprocedure Evaluation (Signed)
Anesthesia Post Note  Patient: Susan Sullivan  Procedure(s) Performed: COLONOSCOPY ESOPHAGOGASTRODUODENOSCOPY (EGD)  Patient location during evaluation: PACU Anesthesia Type: General Level of consciousness: awake and alert Pain management: pain level controlled Vital Signs Assessment: post-procedure vital signs reviewed and stable Respiratory status: spontaneous breathing, nonlabored ventilation and respiratory function stable Cardiovascular status: blood pressure returned to baseline and stable Postop Assessment: no apparent nausea or vomiting Anesthetic complications: no   No notable events documented.   Last Vitals:  Vitals:   09/28/22 0924 09/28/22 0934  BP: 127/76 126/80  Pulse: 70 68  Resp: 17 19  Temp:    SpO2: 100% 100%    Last Pain:  Vitals:   09/28/22 0934  TempSrc:   PainSc: 0-No pain                 Foye Deer

## 2022-09-28 NOTE — Anesthesia Preprocedure Evaluation (Addendum)
Anesthesia Evaluation  Patient identified by MRN, date of birth, ID band Patient awake    Reviewed: Allergy & Precautions, H&P , NPO status , Patient's Chart, lab work & pertinent test results  Airway Mallampati: II  TM Distance: >3 FB Neck ROM: full    Dental no notable dental hx.    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 Kirke Corin Sept 2012: Ectopic pregnancy, tubal     Comment:  left,  with rupture ,  and concurrent right ovarian cyst              rupture No date: Gestational diabetes     Comment:  "only when I'm pregnant" (11/13/2012) No date: MTHFR (methylene THF reductase) deficiency and  homocystinuria (HCC)  Past Surgical History: 2007: AUGMENTATION MAMMAPLASTY; Bilateral 2002; 2010: CESAREAN SECTION Dec 2012: CHOLECYSTECTOMY     Comment:  Festus Barren, [facility] 2012; 2013: DILATION AND CURETTAGE OF UTERUS     Comment:  x 4 06/21/2015: DILITATION & CURRETTAGE/HYSTROSCOPY  WITH NOVASURE  ABLATION; N/A     Comment:  Procedure: DILATATION & CURETTAGE/HYSTEROSCOPY WITH               NOVASURE ABLATION;  Surgeon: Herold Harms, MD;                Location: [facility] ORS;  Service: Gynecology;  Laterality:               N/A; 2012: LAPAROSCOPY FOR ECTOPIC PREGNANCY 11/13/2012: POSTERIOR LUMBAR FUSION 2007: SPINE SURGERY     Comment:  SI joint fusion [facility] June 2014: SPINE SURGERY     Comment:  Delma Officer  L4-L5     Reproductive/Obstetrics negative OB ROS                             Anesthesia Physical Anesthesia Plan  ASA: 2  Anesthesia Plan: General   Post-op Pain Management:    Induction:   PONV Risk Score and Plan: Propofol infusion and TIVA  Airway Management Planned: Natural Airway  Additional Equipment:   Intra-op Plan:   Post-operative Plan:   Informed Consent: I have reviewed the patients History and Physical, chart, labs and discussed the procedure including the risks, benefits and alternatives for the proposed anesthesia with the patient or authorized representative who has indicated his/her understanding and acceptance.     Dental Advisory Given  Plan Discussed with: CRNA  and Surgeon  Anesthesia Plan Comments:         Anesthesia Quick Evaluation

## 2022-09-28 NOTE — Op Note (Addendum)
[facility] Patient Name: Susan Sullivan Procedure Date: 09/28/2022 8:20 AM MRN: [id number] Account #: [id number] Date of Birth: 1975/08/15 Admit Type: Outpatient Age: 47 Room: [facility] ENDO ROOM 1 Gender: Female Note Status: Supervisor Override Instrument Name: Patton Salles Endoscope [id number] Procedure:             Upper GI endoscopy Indications:           Dysphagia, Gastroesophageal reflux disease Providers:             Trenda Moots, DO Referring MD:          Danella Penton, MD (Referring MD) Medicines:             Monitored Anesthesia Care Complications:         No immediate complications. Estimated blood loss:                         Minimal. Procedure:             Pre-Anesthesia Assessment:                        - Prior to the procedure, a History and Physical was                         performed, and patient medications and allergies were                         reviewed. The patient is competent. The risks and                         benefits of the procedure and the sedation options and                         risks were discussed with the patient. All questions                         were answered and informed consent was obtained.                         Patient identification and proposed procedure were                         verified by the physician, the nurse, the anesthetist                         and the technician in the endoscopy suite. Mental                         Status Examination: alert and oriented. Airway                         Examination: normal oropharyngeal airway and neck                         mobility. Respiratory Examination: clear to                         auscultation. CV Examination: RRR, no murmurs, no S3  or S4. Prophylactic Antibiotics: The patient does not                         require prophylactic antibiotics. Prior                         Anticoagulants: The patient  has taken no anticoagulant                         or antiplatelet agents. ASA Grade Assessment: II - A                         patient with mild systemic disease. After reviewing                         the risks and benefits, the patient was deemed in                         satisfactory condition to undergo the procedure. The                         anesthesia plan was to use monitored anesthesia care                         (MAC). Immediately prior to administration of                         medications, the patient was re-assessed for adequacy                         to receive sedatives. The heart rate, respiratory                         rate, oxygen saturations, blood pressure, adequacy of                         pulmonary ventilation, and response to care were                         monitored throughout the procedure. The physical                         status of the patient was re-assessed after the                         procedure.                        After obtaining informed consent, the endoscope was                         passed under direct vision. Throughout the procedure,                         the patient's blood pressure, pulse, and oxygen                         saturations were monitored continuously. The Endoscope  was introduced through the mouth, and advanced to the                         second part of duodenum. The upper GI endoscopy was                         accomplished without difficulty. The patient tolerated                         the procedure well. Findings:      The duodenal bulb, first portion of the duodenum and second portion of       the duodenum were normal. Biopsies for histology were taken with a cold       forceps for evaluation of celiac disease. Estimated blood loss was       minimal.      The entire examined stomach was normal. Biopsies were taken with a cold       forceps for Helicobacter pylori testing.  Estimated blood loss was       minimal.      The Z-line was regular.      Esophagogastric landmarks were identified: the gastroesophageal junction       was found at 39 cm from the incisors.      Normal mucosa was found in the entire esophagus. The scope was       withdrawn. Dilation was performed with a Maloney dilator with no       resistance at 48 Fr and 52 Fr. The dilation site was examined following       endoscope reinsertion and showed no change. Estimated blood loss: none.       Biopsies were obtained from the proximal and distal esophagus with cold       forceps for histology of suspected eosinophilic esophagitis. Estimated       blood loss was minimal.      The exam of the esophagus was otherwise normal. Impression:            - Normal duodenal bulb, first portion of the duodenum                         and second portion of the duodenum. Biopsied.                        - Normal stomach. Biopsied.                        - Z-line regular.                        - Esophagogastric landmarks identified.                        - Normal mucosa was found in the entire esophagus.                         Dilated.                        - Biopsies were taken with a cold forceps for                         evaluation  of eosinophilic esophagitis. Recommendation:        - Patient has a contact number available for                         emergencies. The signs and symptoms of potential                         delayed complications were discussed with the patient.                         Return to normal activities tomorrow. Written                         discharge instructions were provided to the patient.                        - Discharge patient to home.                        - Resume previous diet.                        - Continue present medications.                        - No ibuprofen, naproxen, or other non-steroidal                         anti-inflammatory drugs for 5  days.                        - Await pathology results.                        - Return to GI clinic as previously scheduled.                        - proceed with colonoscopy                        - The findings and recommendations were discussed with                         the patient. Procedure Code(s):     --- Professional ---                        [phone], Esophagogastroduodenoscopy, flexible,                         transoral; with biopsy, single or multiple                        43450, Dilation of esophagus, by unguided sound or                         bougie, single or multiple passes Diagnosis Code(s):     --- Professional ---                        R13.10, Dysphagia, unspecified CPT copyright 2022 American Medical Association. All rights reserved. The codes documented in this  report are preliminary and upon coder review may  be revised to meet current compliance requirements. Attending Participation:      I personally performed the entire procedure. Elfredia Nevins, DO Jaynie Collins DO, DO 09/28/2022 8:40:01 AM This report has been signed electronically. Number of Addenda: 0 Note Initiated On: 09/28/2022 8:20 AM Estimated Blood Loss:  Estimated blood loss was minimal.      [facility]

## 2022-09-28 NOTE — Anesthesia Procedure Notes (Signed)
Procedure Name: MAC Date/Time: 09/28/2022 8:22 AM  Performed by: Cheral Bay, CRNAPre-anesthesia Checklist: Patient identified, Emergency Drugs available, Suction available, Patient being monitored and Timeout performed Patient Re-evaluated:Patient Re-evaluated prior to induction Oxygen Delivery Method: Simple face mask Induction Type: IV induction Placement Confirmation: positive ETCO2 and CO2 detector

## 2022-09-28 NOTE — Op Note (Signed)
[facility] Patient Name: Susan Sullivan Procedure Date: 09/28/2022 8:19 AM MRN: [id number] Account #: [id number] Date of Birth: [birth year] Admit Type: Outpatient Age: 47 Room: [facility] ENDO ROOM 1 Gender: Female Note Status: Finalized Instrument Name: Colonscope [id number] Procedure:             Colonoscopy Indications:           Screening for colorectal malignant neoplasm Providers:             Jaynie Collins DO, DO Referring MD:          Danella Penton, MD (Referring MD) Medicines:             Monitored Anesthesia Care Complications:         No immediate complications. Estimated blood loss:                         Minimal. Procedure:             Pre-Anesthesia Assessment:                        - Prior to the procedure, a History and Physical was                         performed, and patient medications and allergies were                         reviewed. The patient is competent. The risks and                         benefits of the procedure and the sedation options and                         risks were discussed with the patient. All questions                         were answered and informed consent was obtained.                         Patient identification and proposed procedure were                         verified by the physician, the nurse, the anesthetist                         and the technician in the endoscopy suite. Mental                         Status Examination: alert and oriented. Airway                         Examination: normal oropharyngeal airway and neck                         mobility. Respiratory Examination: clear to                         auscultation. CV Examination: RRR, no murmurs, no S3  or S4. Prophylactic Antibiotics: The patient does not                         require prophylactic antibiotics. Prior                         Anticoagulants: The patient has taken no  anticoagulant                         or antiplatelet agents. ASA Grade Assessment: II - A                         patient with mild systemic disease. After reviewing                         the risks and benefits, the patient was deemed in                         satisfactory condition to undergo the procedure. The                         anesthesia plan was to use monitored anesthesia care                         (MAC). Immediately prior to administration of                         medications, the patient was re-assessed for adequacy                         to receive sedatives. The heart rate, respiratory                         rate, oxygen saturations, blood pressure, adequacy of                         pulmonary ventilation, and response to care were                         monitored throughout the procedure. The physical                         status of the patient was re-assessed after the                         procedure.                        After obtaining informed consent, the colonoscope was                         passed under direct vision. Throughout the procedure,                         the patient's blood pressure, pulse, and oxygen                         saturations were monitored continuously. The  Colonoscope was introduced through the anus and                         advanced to the the terminal ileum, with                         identification of the appendiceal orifice and IC                         valve. The colonoscopy was performed without                         difficulty. The patient tolerated the procedure well.                         The quality of the bowel preparation was evaluated                         using the BBPS [facility] Bowel Preparation Scale) with                         scores of: Right Colon = 3, Transverse Colon = 3 and                         Left Colon = 3 (entire mucosa seen well with no                          residual staining, small fragments of stool or opaque                         liquid). The total BBPS score equals 9. The terminal                         ileum, ileocecal valve, appendiceal orifice, and                         rectum were photographed. Findings:      Skin tags were found on perianal exam.      The digital rectal exam was normal. Pertinent negatives include normal       sphincter tone.      The terminal ileum appeared normal. Estimated blood loss: none.      Retroflexion in the right colon was performed.      A 1 to 2 mm polyp was found in the ascending colon. The polyp was       sessile. The polyp was removed with a jumbo cold forceps. Resection and       retrieval were complete. Estimated blood loss was minimal.      Normal mucosa was found in the entire colon. Biopsies for histology were       taken with a cold forceps from the right colon and left colon for       evaluation of microscopic colitis. Estimated blood loss was minimal.      Scattered small-mouthed diverticula were found in the entire colon.       Estimated blood loss: none.      Non-bleeding internal hemorrhoids were found during retroflexion. The       hemorrhoids were Grade I (internal hemorrhoids  that do not prolapse).       Estimated blood loss: none.      A 1 to 2 mm polyp was found in the sigmoid colon. The polyp was sessile.       The polyp was removed with a jumbo cold forceps. Resection and retrieval       were complete. Estimated blood loss was minimal.      The exam was otherwise without abnormality on direct and retroflexion       views. Impression:            - Perianal skin tags found on perianal exam.                        - The examined portion of the ileum was normal.                        - One 1 to 2 mm polyp in the ascending colon, removed                         with a jumbo cold forceps. Resected and retrieved.                        - Normal mucosa in the entire examined  colon. Biopsied.                        - Diverticulosis in the entire examined colon.                        - Non-bleeding internal hemorrhoids.                        - One 1 to 2 mm polyp in the sigmoid colon, removed                         with a jumbo cold forceps. Resected and retrieved.                        - The examination was otherwise normal on direct and                         retroflexion views. Recommendation:        - Patient has a contact number available for                         emergencies. The signs and symptoms of potential                         delayed complications were discussed with the patient.                         Return to normal activities tomorrow. Written                         discharge instructions were provided to the patient.                        - Discharge patient to home.                        -  Resume previous diet.                        - Continue present medications.                        - No ibuprofen, naproxen, or other non-steroidal                         anti-inflammatory drugs.                        - Repeat colonoscopy for surveillance based on                         pathology results.                        - Return to GI office as previously scheduled.                        - The findings and recommendations were discussed with                         the patient. Procedure Code(s):     --- Professional ---                        [phone], Colonoscopy, flexible; with biopsy, single or                         multiple Diagnosis Code(s):     --- Professional ---                        Z12.11, Encounter for screening for malignant neoplasm                         of colon                        K64.0, First degree hemorrhoids                        D12.2, Benign neoplasm of ascending colon                        D12.5, Benign neoplasm of sigmoid colon                        K64.4, Residual hemorrhoidal skin tags                         K57.30, Diverticulosis of large intestine without                         perforation or abscess without bleeding CPT copyright 2022 American Medical Association. All rights reserved. The codes documented in this report are preliminary and upon coder review may  be revised to meet current compliance requirements. Attending Participation:      I personally performed the entire procedure. Elfredia Nevins, DO Jaynie Collins DO, DO 09/28/2022 9:06:13 AM This report has been signed electronically. Number of Addenda: 0 Note Initiated On: 09/28/2022 8:19  AM Scope Withdrawal Time: 0 hours 13 minutes 21 seconds  Total Procedure Duration: 0 hours 18 minutes 10 seconds  Estimated Blood Loss:  Estimated blood loss was minimal.      [facility]

## 2022-09-29 LAB — SURGICAL PATHOLOGY
